# Patient Record
Sex: Female | Born: 1990 | Race: Black or African American | Hispanic: No | Marital: Married | Smoking: Never smoker
Health system: Southern US, Community
[De-identification: ages and names within clinical notes are randomized; demographics above are authoritative.]

## PROBLEM LIST (undated history)

## (undated) ENCOUNTER — Inpatient Hospital Stay (HOSPITAL_COMMUNITY): Payer: Self-pay

## (undated) ENCOUNTER — Inpatient Hospital Stay: Payer: Self-pay

## (undated) DIAGNOSIS — O139 Gestational [pregnancy-induced] hypertension without significant proteinuria, unspecified trimester: Secondary | ICD-10-CM

## (undated) DIAGNOSIS — Z789 Other specified health status: Secondary | ICD-10-CM

## (undated) DIAGNOSIS — J45909 Unspecified asthma, uncomplicated: Secondary | ICD-10-CM

## (undated) DIAGNOSIS — B009 Herpesviral infection, unspecified: Secondary | ICD-10-CM

## (undated) HISTORY — PX: CERVICAL CERCLAGE: SHX1329

---

## 2014-03-13 ENCOUNTER — Emergency Department: Payer: Self-pay | Admitting: Emergency Medicine

## 2014-05-02 ENCOUNTER — Emergency Department: Payer: Self-pay | Admitting: Emergency Medicine

## 2014-05-26 ENCOUNTER — Emergency Department: Payer: Self-pay | Admitting: Emergency Medicine

## 2014-06-05 ENCOUNTER — Emergency Department: Payer: Self-pay | Admitting: Emergency Medicine

## 2015-06-17 ENCOUNTER — Emergency Department
Admission: EM | Admit: 2015-06-17 | Discharge: 2015-06-17 | Disposition: A | Discharge status: Home / Self Care | Payer: Medicaid Other | Attending: Emergency Medicine | Admitting: Emergency Medicine

## 2015-06-17 DIAGNOSIS — O98519 Other viral diseases complicating pregnancy, unspecified trimester: Secondary | ICD-10-CM | POA: Insufficient documentation

## 2015-06-17 DIAGNOSIS — B349 Viral infection, unspecified: Secondary | ICD-10-CM | POA: Insufficient documentation

## 2015-06-17 DIAGNOSIS — Z3A00 Weeks of gestation of pregnancy not specified: Secondary | ICD-10-CM | POA: Diagnosis not present

## 2015-06-17 DIAGNOSIS — Z3490 Encounter for supervision of normal pregnancy, unspecified, unspecified trimester: Secondary | ICD-10-CM

## 2015-06-17 DIAGNOSIS — O210 Mild hyperemesis gravidarum: Secondary | ICD-10-CM | POA: Diagnosis present

## 2015-06-17 LAB — URINALYSIS COMPLETE WITH MICROSCOPIC (ARMC ONLY)
BILIRUBIN URINE: NEGATIVE
Bacteria, UA: NONE SEEN
GLUCOSE, UA: NEGATIVE mg/dL
HGB URINE DIPSTICK: NEGATIVE
Ketones, ur: NEGATIVE mg/dL
LEUKOCYTES UA: NEGATIVE
NITRITE: NEGATIVE
Protein, ur: NEGATIVE mg/dL
SPECIFIC GRAVITY, URINE: 1.029 (ref 1.005–1.030)
pH: 6 (ref 5.0–8.0)

## 2015-06-17 LAB — POCT PREGNANCY, URINE: PREG TEST UR: POSITIVE — AB

## 2015-06-17 LAB — CBC WITH DIFFERENTIAL/PLATELET
Basophils Absolute: 0 K/uL (ref 0–0.1)
Basophils Relative: 1 %
Eosinophils Absolute: 0.1 K/uL (ref 0–0.7)
Eosinophils Relative: 2 %
HCT: 40.6 % (ref 35.0–47.0)
Hemoglobin: 12.8 g/dL (ref 12.0–16.0)
Lymphocytes Relative: 22 %
Lymphs Abs: 2 K/uL (ref 1.0–3.6)
MCH: 25.8 pg — ABNORMAL LOW (ref 26.0–34.0)
MCHC: 31.4 g/dL — ABNORMAL LOW (ref 32.0–36.0)
MCV: 82.1 fL (ref 80.0–100.0)
Monocytes Absolute: 0.8 K/uL (ref 0.2–0.9)
Monocytes Relative: 9 %
Neutro Abs: 6.4 K/uL (ref 1.4–6.5)
Neutrophils Relative %: 68 %
Platelets: 207 K/uL (ref 150–440)
RBC: 4.95 MIL/uL (ref 3.80–5.20)
RDW: 18.2 % — ABNORMAL HIGH (ref 11.5–14.5)
WBC: 9.5 K/uL (ref 3.6–11.0)

## 2015-06-17 LAB — BASIC METABOLIC PANEL
ANION GAP: 7 (ref 5–15)
BUN: 10 mg/dL (ref 6–20)
CALCIUM: 9.5 mg/dL (ref 8.9–10.3)
CO2: 23 mmol/L (ref 22–32)
CREATININE: 0.48 mg/dL (ref 0.44–1.00)
Chloride: 106 mmol/L (ref 101–111)
GFR calc Af Amer: >60 mL/min (ref >60)
GLUCOSE: 86 mg/dL (ref 65–99)
Potassium: 3.8 mmol/L (ref 3.5–5.1)
Sodium: 136 mmol/L (ref 135–145)

## 2015-06-17 MED ORDER — ONDANSETRON 4 MG PO TBDP: 4.0000 mg | ORAL_TABLET | Freq: Three times a day (TID) | ORAL | Status: DC | PRN

## 2015-06-17 NOTE — ED Notes (Signed)
Patient comes in with generalized complaints of "not feeling well" for one month.  When patient questioned about symptoms patient states that she has fevers to come and go along with decreased appetite  and nausea for one month.

## 2015-06-17 NOTE — Discharge Instructions (Signed)
First Trimester of Pregnancy The first trimester of pregnancy is from week 1 until the end of week 12 (months 1 through 3). During this time, your baby will begin to develop inside you. At 6-8 weeks, the eyes and face are formed, and the heartbeat can be seen on ultrasound. At the end of 12 weeks, all the baby's organs are formed. Prenatal care is all the medical care you receive before the birth of your baby. Make sure you get good prenatal care and follow all of your doctor's instructions. HOME CARE  Medicines  Take medicine only as told by your doctor. Some medicines are safe and some are not during pregnancy.  Take your prenatal vitamins as told by your doctor.  Take medicine that helps you poop (stool softener) as needed if your doctor says it is okay. Diet  Eat regular, healthy meals.  Your doctor will tell you the amount of weight gain that is right for you.  Avoid raw meat and uncooked cheese.  If you feel sick to your stomach (nauseous) or throw up (vomit):  Eat 4 or 5 small meals a day instead of 3 large meals.  Try eating a few soda crackers.  Drink liquids between meals instead of during meals.  If you have a hard time pooping (constipation):  Eat high-fiber foods like fresh vegetables, fruit, and whole grains.  Drink enough fluids to keep your pee (urine) clear or pale yellow. Activity and Exercise  Exercise only as told by your doctor. Stop exercising if you have cramps or pain in your lower belly (abdomen) or low back.  Try to avoid standing for long periods of time. Move your legs often if you must stand in one place for a long time.  Avoid heavy lifting.  Wear low-heeled shoes. Sit and stand up straight.  You can have sex unless your doctor tells you not to. Relief of Pain or Discomfort  Wear a good support bra if your breasts are sore.  Take warm water baths (sitz baths) to soothe pain or discomfort caused by hemorrhoids. Use hemorrhoid cream if your  doctor says it is okay.  Rest with your legs raised if you have leg cramps or low back pain.  Wear support hose if you have puffy, bulging veins (varicose veins) in your legs. Raise (elevate) your feet for 15 minutes, 3-4 times a day. Limit salt in your diet. Prenatal Care  Schedule your prenatal visits by the twelfth week of pregnancy.  Write down your questions. Take them to your prenatal visits.  Keep all your prenatal visits as told by your doctor. Safety  Wear your seat belt at all times when driving.  Make a list of emergency phone numbers. The list should include numbers for family, friends, the hospital, and police and fire departments. General Tips  Ask your doctor for a referral to a local prenatal class. Begin classes no later than at the start of month 6 of your pregnancy.  Ask for help if you need counseling or help with nutrition. Your doctor can give you advice or tell you where to go for help.  Do not use hot tubs, steam rooms, or saunas.  Do not douche or use tampons or scented sanitary pads.  Do not cross your legs for long periods of time.  Avoid litter boxes and soil used by cats.  Avoid all smoking, herbs, and alcohol. Avoid drugs not approved by your doctor.  Do not use any tobacco products, including cigarettes,  chewing tobacco, and electronic cigarettes. If you need help quitting, ask your doctor. You may get counseling or other support to help you quit.  Visit your dentist. At home, brush your teeth with a soft toothbrush. Be gentle when you floss. GET HELP IF:  You are dizzy.  You have mild cramps or pressure in your lower belly.  You have a nagging pain in your belly area.  You continue to feel sick to your stomach, throw up, or have watery poop (diarrhea).  You have a bad smelling fluid coming from your vagina.  You have pain with peeing (urination).  You have increased puffiness (swelling) in your face, hands, legs, or ankles. GET HELP  RIGHT AWAY IF:   You have a fever.  You are leaking fluid from your vagina.  You have spotting or bleeding from your vagina.  You have very bad belly cramping or pain.  You gain or lose weight rapidly.  You throw up blood. It may look like coffee grounds.  You are around people who have [place] measles, fifth disease, or chickenpox.  You have a very bad headache.  You have shortness of breath.  You have any kind of trauma, such as from a fall or a car accident.   This information is not intended to replace advice given to you by your health care provider. Make sure you discuss any questions you have with your health care provider.   Document Released: 12/16/2007 Document Revised: 07/20/2014 Document Reviewed: 05/09/2013 Elsevier Interactive Patient Education 2016 [employer]   [facility]. Zofran as needed for nausea. Increase fluid intake. You may take Tylenol for muscle aches but do not take any anti-inflammatories.

## 2015-06-17 NOTE — ED Provider Notes (Signed)
[facility] Emergency Department Provider Note ____________________________________________  Time seen: Approximately 8:33 AM  I have reviewed the triage vital signs and the nursing notes.   HISTORY  Chief Complaint Nausea  HPI Charlene Key is a 24 y.o. female states that she has not been feeling well for one month. Patient states that she has multiple symptoms including congestion, nausea, decreased appetite for one month. She also states that she has had some diarrhea and reports that she is had diarrhea once today and vomited twice.She states she has had low-grade temp during this time. She has not taken any over-the-counter medication for this and has not seen any doctors in the month that she has been sick. She denies any urinary symptoms, she denies any ear pain, she denies any productive cough.   History reviewed. No pertinent past medical history.  There are no active problems to display for this patient.   History reviewed. No pertinent past surgical history.  Current Outpatient Rx  Name  Route  Sig  Dispense  Refill  . ondansetron (ZOFRAN ODT) 4 MG disintegrating tablet   Oral   Take 1 tablet (4 mg total) by mouth every 8 (eight) hours as needed for nausea or vomiting.   12 tablet   0     Allergies Review of patient's allergies indicates no known allergies.  History reviewed. No pertinent family history.  Social History Social History  Substance Use Topics  . Smoking status: Never Smoker   . Smokeless tobacco: None  . Alcohol Use: No    Review of Systems Constitutional: Positive low-grade fever/no chills Eyes: No visual changes. ENT: No sore throat. Cardiovascular: Denies chest pain. Respiratory: Denies shortness of breath. Gastrointestinal: No abdominal pain.  Positive nausea, positive vomiting.  Positive diarrhea.  No constipation. Genitourinary: Negative for dysuria. Musculoskeletal: Negative for back pain. Skin: Negative for  rash. Neurological: Negative for headaches, focal weakness or numbness.  10-point ROS otherwise negative.  ____________________________________________   PHYSICAL EXAM:  VITAL SIGNS: ED Triage Vitals  Enc Vitals Group     BP 06/17/15 0816 125/59 mmHg     Pulse Rate 06/17/15 0816 86     Resp 06/17/15 0816 18     Temp 06/17/15 0816 98.1 F (36.7 C)     Temp Source 06/17/15 0816 Oral     SpO2 06/17/15 0816 99 %     Weight 06/17/15 0816 200 lb (90.719 kg)     Height 06/17/15 0816  (1.6 m)     Head Cir --      Peak Flow --      Pain Score --      Pain Loc --      Pain Edu? --      Excl. in GC? --     Constitutional: Alert and oriented. Well appearing and in no acute distress. Eyes: Conjunctivae are normal. PERRL. EOMI. Head: Atraumatic. Nose: No congestion/rhinnorhea. Mouth/Throat: Mucous membranes are moist.  Oropharynx non-erythematous. Neck: No stridor.   Hematological/Lymphatic/Immunilogical: No cervical lymphadenopathy. Cardiovascular: Normal rate, regular rhythm. Grossly normal heart sounds.  Good peripheral circulation. Respiratory: Normal respiratory effort.  No retractions. Lungs CTAB. Gastrointestinal: Soft and nontender. No distention.  Musculoskeletal: No lower extremity tenderness nor edema.  No joint effusions. Neurologic:  Normal speech and language. No gross focal neurologic deficits are appreciated. No gait instability. Skin:  Skin is warm, dry and intact. No rash noted. Psychiatric: Mood and affect are normal. Speech and behavior are normal.  ____________________________________________  LABS (all labs ordered are listed, but only abnormal results are displayed)  Labs Reviewed  CBC WITH DIFFERENTIAL/PLATELET - Abnormal; Notable for the following:    MCH 25.8 (*)    MCHC 31.4 (*)    RDW 18.2 (*)    All other components within normal limits  URINALYSIS COMPLETEWITH MICROSCOPIC ([facility] ONLY) - Abnormal; Notable for the following:    Color, Urine  YELLOW (*)    APPearance CLEAR (*)    Squamous Epithelial / LPF 0-5 (*)    All other components within normal limits  POCT PREGNANCY, URINE - Abnormal; Notable for the following:    Preg Test, Ur POSITIVE (*)    All other components within normal limits  BASIC METABOLIC PANEL  POC URINE PREG, ED    PROCEDURES  Procedure(s) performed: None  Critical Care performed: No  ____________________________________________   INITIAL IMPRESSION / ASSESSMENT AND PLAN / ED COURSE  Pertinent labs & imaging results that were available during my care of the patient were reviewed by me and considered in my medical decision making (see chart for details).  Patient's pregnancy test was positive and patient was made aware. She is going to [facility]. She is given a prescription for Zofran as needed for nausea. She was also made aware that she may take Tylenol but to avoid anti-inflammatories. ____________________________________________   FINAL CLINICAL IMPRESSION(S) / ED DIAGNOSES  Final diagnoses:  Pregnancy  Viral illness      Tommi RumpsRhonda L Ashlen Kiger, PA-C 06/17/15 1054  Emily FilbertJonathan E Williams, MD 06/17/15 [id number]

## 2015-09-05 DIAGNOSIS — E669 Obesity, unspecified: Secondary | ICD-10-CM | POA: Insufficient documentation

## 2015-09-05 DIAGNOSIS — B009 Herpesviral infection, unspecified: Secondary | ICD-10-CM | POA: Insufficient documentation

## 2015-09-07 DIAGNOSIS — N883 Incompetence of cervix uteri: Secondary | ICD-10-CM | POA: Insufficient documentation

## 2015-09-27 DIAGNOSIS — Z8751 Personal history of pre-term labor: Secondary | ICD-10-CM | POA: Insufficient documentation

## 2016-07-13 HISTORY — PX: HYSTEROTOMY: SHX1776

## 2016-07-14 ENCOUNTER — Encounter (HOSPITAL_COMMUNITY): Payer: Self-pay | Admitting: Emergency Medicine

## 2016-07-14 ENCOUNTER — Emergency Department (HOSPITAL_COMMUNITY)
Admission: EM | Admit: 2016-07-14 | Discharge: 2016-07-15 | Disposition: A | Discharge status: Home / Self Care | Payer: Medicaid Other | Attending: Emergency Medicine | Admitting: Emergency Medicine

## 2016-07-14 DIAGNOSIS — R109 Unspecified abdominal pain: Secondary | ICD-10-CM | POA: Insufficient documentation

## 2016-07-14 DIAGNOSIS — O26892 Other specified pregnancy related conditions, second trimester: Secondary | ICD-10-CM | POA: Diagnosis present

## 2016-07-14 DIAGNOSIS — Z3A16 16 weeks gestation of pregnancy: Secondary | ICD-10-CM | POA: Diagnosis not present

## 2016-07-14 DIAGNOSIS — N898 Other specified noninflammatory disorders of vagina: Secondary | ICD-10-CM | POA: Diagnosis not present

## 2016-07-14 NOTE — ED Triage Notes (Signed)
Pt states she did not have dilation and curettage but a stitch placed in cervix.

## 2016-07-14 NOTE — ED Provider Notes (Signed)
[facility]-EMERGENCY DEPT Provider Note   CSN: [id number] Arrival date & time: 07/14/16  2058  By signing my name below, I, Bobbie Stackhristopher Reid, attest that this documentation has been prepared under the direction and in the presence of Zadie Rhineonald Mirely Pangle, MD. Electronically Signed: Bobbie Stackhristopher Reid, Scribe. 07/14/16. 11:41 PM. History   Chief Complaint Chief Complaint  Patient presents with  . Vaginal Discharge     The history is provided by the patient. No language interpreter was used.  Vaginal Discharge   This is a new problem. The current episode started 6 to 12 hours ago. The discharge occurs during urination. The discharge was bloody. She is pregnant. Associated symptoms include abdominal pain. Pertinent negatives include no fever, no vomiting and no dysuria. Her past medical history does not include STD.  Patient was at work earlier today and started to have some abdominal pain. She is 16 weeks into her current pregnancy; she had cerclage placed at 12 weeks.  She denies any fluid loss. She denies any recent falls or MVA. Patient has not been sexually active recently.  She is now feeling improved.   PMH - multiparity  OB History    Gravida Para Term Preterm AB Living   1             SAB TAB Ectopic Multiple Live Births                   Home Medications    Prior to Admission medications   Medication Sig Start Date End Date Taking? Authorizing Provider  ondansetron (ZOFRAN ODT) 4 MG disintegrating tablet Take 1 tablet (4 mg total) by mouth every 8 (eight) hours as needed for nausea or vomiting. 06/17/15   Tommi Rumpshonda L Summers, PA-C    Family History History reviewed. No pertinent family history.  Social History Social History  Substance Use Topics  . Smoking status: Never Smoker  . Smokeless tobacco: Never Used  . Alcohol use No     Allergies   Patient has no known allergies.   Review of Systems Review of Systems  Constitutional: Negative for fever.  Gastrointestinal:  Positive for abdominal pain. Negative for vomiting.  Genitourinary: Positive for vaginal discharge. Negative for dysuria.  All other systems reviewed and are negative.    Physical Exam Updated Vital Signs BP 125/70 (BP Location: Left Arm)   Pulse 86   Temp 98.2 F (36.8 C) (Oral)   Resp 18   Ht 5\' 3"  (1.6 m)   Wt 200 lb (90.7 kg)   SpO2 100%   BMI 35.43 kg/m   Physical Exam CONSTITUTIONAL: Well developed/well nourished HEAD: Normocephalic/atraumatic EYES: EOMI/PERRL ENMT: Mucous membranes moist NECK: supple no meningeal signs SPINE/BACK:entire spine nontender CV: S1/S2 noted, no murmurs/rubs/gallops noted LUNGS: Lungs are clear to auscultation bilaterally, no apparent distress ABDOMEN: soft, nontender, no rebound or guarding, bowel sounds noted throughout abdomen GU:no cva tenderness, no vaginal bleeding, small amount of brownish discharge.  No sutures noted.  No products of conception noted.  Nurse Darl PikesSusan chaperone present NEURO: Pt is awake/alert/appropriate, moves all extremitiesx4.  No facial droop.   EXTREMITIES: pulses normal/equal, full ROM SKIN: warm, color normal PSYCH: no abnormalities of mood noted, alert and oriented to situation   ED Treatments / Results  DIAGNOSTIC STUDIES: Oxygen Saturation is 100% on RA, normal by my interpretation.    COORDINATION OF CARE: 11:41 PM Discussed treatment plan with pt at bedside and pt agreed to plan.  Labs (all labs ordered are listed, but only  abnormal results are displayed) Labs Reviewed  WET PREP, GENITAL - Abnormal; Notable for the following:       Result Value   WBC, Wet Prep HPF POC MANY (*)    All other components within normal limits  URINALYSIS, ROUTINE W REFLEX MICROSCOPIC - Abnormal; Notable for the following:    APPearance HAZY (*)    Specific Gravity, Urine 1.038 (*)    Bilirubin Urine SMALL (*)    Ketones, ur 5 (*)    Protein, ur 30 (*)    Leukocytes, UA TRACE (*)    Bacteria, UA RARE (*)    All  other components within normal limits  URINE CULTURE  GC/CHLAMYDIA PROBE AMP ([place]) NOT AT [facility]    EKG  EKG Interpretation None       Radiology No results found.  Procedures Procedures (including critical care time)  Medications Ordered in ED Medications - No data to display   Initial Impression / Assessment and Plan / ED Course  I have reviewed the triage vital signs and the nursing notes.  Pertinent labs  results that were available during my care of the patient were reviewed by me and considered in my medical decision making (see chart for details).  Clinical Course     Pt stable Abdominal exam - nontender No vag bleeding noted on exam I feel she is appropriate for d/c home She is instructed to call her OB later today for close f/u to have re-exam to ensure no new issues with cervical cerclage Otherwise no signs of acute abdominal/OB/GYN emergency   Final Clinical Impressions(s) / ED Diagnoses   Final diagnoses:  Vaginal discharge    New Prescriptions New Prescriptions   No medications on file   I personally performed the services described in this documentation, which was scribed in my presence. The recorded information has been reviewed and is accurate.       Zadie Rhine, MD 07/15/16 [phone]

## 2016-07-14 NOTE — ED Triage Notes (Signed)
Pt states she had dilation and curettage last month and today developed abd pain with brown discharge.

## 2016-07-15 ENCOUNTER — Encounter (HOSPITAL_COMMUNITY): Payer: Self-pay

## 2016-07-15 LAB — WET PREP, GENITAL
CLUE CELLS WET PREP: NONE SEEN
SPERM: NONE SEEN
Trich, Wet Prep: NONE SEEN
YEAST WET PREP: NONE SEEN

## 2016-07-15 LAB — URINALYSIS, ROUTINE W REFLEX MICROSCOPIC
GLUCOSE, UA: NEGATIVE mg/dL
HGB URINE DIPSTICK: NEGATIVE
Ketones, ur: 5 mg/dL — AB
NITRITE: NEGATIVE
Protein, ur: 30 mg/dL — AB
SPECIFIC GRAVITY, URINE: 1.038 — AB (ref 1.005–1.030)
WBC, UA: NONE SEEN WBC/hpf (ref 0–5)
pH: 5 (ref 5.0–8.0)

## 2016-07-15 NOTE — Discharge Instructions (Signed)
PLEASE CALL YOUR OB DOCTOR LATER TODAY FOR CLOSE FOLLOWUP PLEASE RETURN FOR ANY WORSENED ABDOMINAL PAIN OR VAGINAL BLEEDING OVER NEXT 24-48 HOURS

## 2016-07-16 LAB — URINE CULTURE

## 2016-07-16 LAB — GC/CHLAMYDIA PROBE AMP (~~LOC~~) NOT AT ARMC
Chlamydia: NEGATIVE
Neisseria Gonorrhea: NEGATIVE

## 2016-09-16 ENCOUNTER — Inpatient Hospital Stay (HOSPITAL_COMMUNITY)
Admission: AD | Admit: 2016-09-16 | Discharge: 2016-09-17 | Disposition: A | Discharge status: Home / Self Care | Payer: Medicaid Other | Attending: Obstetrics & Gynecology | Admitting: Obstetrics & Gynecology

## 2016-09-16 ENCOUNTER — Encounter (HOSPITAL_COMMUNITY): Payer: Self-pay | Admitting: *Deleted

## 2016-09-16 DIAGNOSIS — O26892 Other specified pregnancy related conditions, second trimester: Secondary | ICD-10-CM | POA: Diagnosis not present

## 2016-09-16 DIAGNOSIS — Z3A25 25 weeks gestation of pregnancy: Secondary | ICD-10-CM | POA: Diagnosis present

## 2016-09-16 DIAGNOSIS — R1030 Lower abdominal pain, unspecified: Secondary | ICD-10-CM | POA: Insufficient documentation

## 2016-09-16 HISTORY — DX: Other specified health status: Z78.9

## 2016-09-16 NOTE — ED Notes (Signed)
[facility]

## 2016-09-16 NOTE — ED Provider Notes (Signed)
[facility] Provider Note   CSN: [id number] Arrival date & time: 09/16/16  2004  By signing my name below, I, Alyssa Grove, attest that this documentation has been prepared under the direction and in the presence of Loren Racer, MD. Electronically Signed: Alyssa Grove, ED Scribe. 09/16/16. 9:43 PM.   History   Chief Complaint Chief Complaint  Patient presents with  . Abdominal Pain   The history is provided by the patient. No language interpreter was used.   HPI Comments: Charlene Key is a 26 y.o. female who presents to the Emergency Department complaining of gradual onset, intermittent, moderate suprapubic abdominal pain for 1 day. She is currently [redacted] weeks pregnant (via ultrasound). This is the patients 7th pregnancy. No problems with this pregnancy. Pain feels similar to contractions and notes pain does not come every few minutes but is "spaced out". Pt states she is feeling the movements of the baby. Pt denies nausea, vomiting, diarrhea, vaginal discharge, vaginal bleeding, fever, chills, or any other complaints at this time.  History reviewed. No pertinent past medical history.  There are no active problems to display for this patient.   History reviewed. No pertinent surgical history.  OB History    Gravida Para Term Preterm AB Living   7 6 5 1   6    SAB TAB Ectopic Multiple Live Births         1 7       Home Medications    Prior to Admission medications   Medication Sig Start Date End Date Taking? Authorizing Provider  Prenatal Vit-Fe Fumarate-FA (PRENATAL MULTIVITAMIN) TABS tablet Take 1 tablet by mouth daily.   Yes Historical Provider, MD    Family History History reviewed. No pertinent family history.  Social History Social History  Substance Use Topics  . Smoking status: Never Smoker  . Smokeless tobacco: Never Used  . Alcohol use No     Allergies   Patient has no known allergies.  Review of Systems Review of Systems  Constitutional:  Negative for chills and fever.  Gastrointestinal: Positive for abdominal pain. Negative for constipation, diarrhea, nausea and vomiting.  Genitourinary: Negative for dysuria, flank pain, pelvic pain, vaginal bleeding and vaginal discharge.  Musculoskeletal: Negative for back pain, myalgias, neck pain and neck stiffness.  Neurological: Negative for dizziness, weakness, light-headedness, numbness and headaches.  All other systems reviewed and are negative.   Physical Exam Updated Vital Signs BP 123/77   Pulse 96   Temp 98.8 F (37.1 C) (Oral)   Resp 18   Ht 5\' 3"  (1.6 m)   Wt 200 lb (90.7 kg)   SpO2 100%   BMI 35.43 kg/m   Physical Exam  Constitutional: She is oriented to person, place, and time. She appears well-developed and well-nourished.  HENT:  Head: Normocephalic and atraumatic.  Mouth/Throat: Oropharynx is clear and moist.  Eyes: EOM are normal. Pupils are equal, round, and reactive to light.  Neck: Normal range of motion. Neck supple.  Cardiovascular: Normal rate and regular rhythm.   Pulmonary/Chest: Effort normal and breath sounds normal.  Abdominal: Soft. Bowel sounds are normal. There is no tenderness. There is no rebound and no guarding.  Gravid abdomen with uterine fundus above the umbilicus. No tenderness to palpation.   Musculoskeletal: Normal range of motion. She exhibits no edema or tenderness.  Neurological: She is alert and oriented to person, place, and time.  Skin: Skin is warm and dry. No rash noted. No erythema.  Psychiatric: She has a  normal mood and affect. Her behavior is normal.  Nursing note and vitals reviewed.    ED Treatments / Results  DIAGNOSTIC STUDIES: Oxygen Saturation is 100% on RA, normal by my interpretation.    COORDINATION OF CARE: 9:37 PM Discussed treatment plan with pt at bedside which includes transfer for overnight monitoring and pt agreed to plan.  Labs (all labs ordered are listed, but only abnormal results are  displayed) Labs Reviewed - No data to display  EKG  EKG Interpretation None       Radiology No results found.  Procedures Procedures (including critical care time)  Medications Ordered in ED Medications - No data to display   Initial Impression / Assessment and Plan / ED Course  I have reviewed the triage vital signs and the nursing notes.  Pertinent labs & imaging results that were available during my care of the patient were reviewed by me and considered in my medical decision making (see chart for details).   She is very well-appearing. She has no abdominal pain at this point. Normal fetal heart tones. No evidence of current contractions. She has history of cerclage placement. Discussed with OB on call Dr. Katrinka BlazingSmith. Advises transfer to the MAU for obstetric evaluation.  I personally performed the services described in this documentation, which was scribed in my presence. The recorded information has been reviewed and is accurate.     Final Clinical Impressions(s) / ED Diagnoses   Final diagnoses:  Lower abdominal pain  [redacted] weeks gestation of pregnancy    New Prescriptions New Prescriptions   No medications on file     Loren Raceravid Ayodeji Keimig, MD 09/16/16 2233

## 2016-09-16 NOTE — Progress Notes (Signed)
[facility] called about patient who presents to [facility] ED with complaints of abdominal pain and contractions x1 day; patient is a G7P5 at 25 and 4/[redacted] weeks along in her pregnancy at this time; patient stated her previous delivery was at 24 weeks; she received a cerclage at 12 weeks for this pregnancy; [facility] [place]; [facility] and assessing at this time; Dr Erin FullingHarraway-Smith notified of patient's arrival, complaints, and previous history; orders given to continue EFM and when patient is cleared medically for abdominal pain she can be transferred to MAU for further assessment; Casimiro NeedleMichael, ED RN made aware of orders at this time

## 2016-09-16 NOTE — ED Triage Notes (Signed)
Pt c/o suprapubic abdominal pain that started yesterday; pt denies any n/v/d; pt denies any vaginal discharge but states the pain feels like contractions, pt is [redacted] weeks pregnant; pt states the pain is intermittent

## 2016-09-17 ENCOUNTER — Encounter (HOSPITAL_COMMUNITY): Payer: Self-pay

## 2016-09-17 DIAGNOSIS — Z3A25 25 weeks gestation of pregnancy: Secondary | ICD-10-CM | POA: Diagnosis present

## 2016-09-17 DIAGNOSIS — R1030 Lower abdominal pain, unspecified: Secondary | ICD-10-CM | POA: Diagnosis present

## 2016-09-17 DIAGNOSIS — O26892 Other specified pregnancy related conditions, second trimester: Secondary | ICD-10-CM | POA: Diagnosis not present

## 2016-09-17 NOTE — Discharge Instructions (Signed)

## 2016-09-17 NOTE — MAU Provider Note (Signed)
Chief Complaint: Abdominal Pain   First Provider Initiated Contact with Patient 09/17/16 [phone]     SUBJECTIVE HPI: Charlene Key is a 26 y.o. [id number] at [redacted]w[redacted]d who presents to Maternity Admissions from [facility] ED for further eval of 1 day hx of contractions. Patient has cerclage in place since 12 weeks due to h/o 24 week delivery.  Denies leakage of fluid or vaginal bleeding. Good fetal movement.   Location: lower abdomen Quality: sharp Severity: 3/10 in pain scale Duration: intermitent Context: pregnant Timing: yesterday Modifying factors: none Associated signs and symptoms: none  Pregnancy Course:   Past Medical History:  Diagnosis Date  . Medical history non-contributory    OB History  Gravida Para Term Preterm AB Living  7 6 5 1   6   SAB TAB Ectopic Multiple Live Births        1 7    # Outcome Date GA Lbr Len/2nd Weight Sex Delivery Anes PTL Lv  7 Current           6A Preterm 09/27/15    F CS-Classical None  LIV  6B Preterm 09/27/15    F CS-Classical  Y DEC  5 Term 10/17/14    M Vag-Spont   LIV  4 Term 07/11/12    M Vag-Spont None  LIV  3 Term 11/10/10    M Vag-Spont  N LIV  2 Term 09/22/09    M Vag-Spont  N LIV  1 Term 02/15/07    Kateri Plummer LIV     Past Surgical History:  Procedure Laterality Date  . CESAREAN SECTION     History reviewed. No pertinent family history. Social History  Substance Use Topics  . Smoking status: Never Smoker  . Smokeless tobacco: Never Used  . Alcohol use No   No Known Allergies Prescriptions Prior to Admission  Medication Sig Dispense Refill Last Dose  . Prenatal Vit-Fe Fumarate-FA (PRENATAL MULTIVITAMIN) TABS tablet Take 1 tablet by mouth daily.   09/16/2016 at Unknown time    I have reviewed patient's Past Medical Hx, Surgical Hx, Family Hx, Social Hx, medications and allergies.   ROS:  Review of Systems  Constitutional: Negative for chills and fever.  HENT: Negative for congestion and sore throat.   Eyes: Negative for  photophobia and visual disturbance.  Respiratory: Negative for shortness of breath and wheezing.   Cardiovascular: Negative for chest pain, palpitations and leg swelling.  Gastrointestinal: Positive for abdominal pain. Negative for nausea and vomiting.  Genitourinary: Negative for dysuria, frequency, urgency, vaginal bleeding, vaginal discharge and vaginal pain.  Musculoskeletal: Negative for back pain and neck pain.  Neurological: Negative for light-headedness and headaches.    Physical Exam   Patient Vitals for the past 24 hrs:  BP Temp Temp src Pulse Resp SpO2 Height Weight  09/17/16 0135 132/65 98.1 F (36.7 C) Oral 83 18 - - -  09/17/16 0000 (!) 109/54 - - 81 18 100 % - -  09/16/16 2330 115/67 - - 84 - 100 % - -  09/16/16 2300 112/75 - - 85 - 100 % - -  09/16/16 2230 119/74 - - 79 - 100 % - -  09/16/16 2200 123/77 - - 96 18 100 % - -  09/16/16 2134 114/65 - - 89 20 99 % - -  09/16/16 2030 124/67 98.8 F (37.1 C) Oral 94 18 100 % 5\' 3"  (1.6 m) 200 lb (90.7 kg)   Constitutional: Well-developed, well-nourished female in no acute distress.  Cardiovascular: normal rate Respiratory: normal effort GI: Abd soft, non-tender, gravid appropriate for gestational age. Pos BS x 4 MS: Extremities nontender, no edema, normal ROM Neurologic: Alert and oriented x 4.  GU: Neg CVAT.  Pelvic: NEFG, physiologic discharge, no blood, cervix clean. No CMT  Dilation: Closed Exam by:: Dr. Abelardo DieselMcmullen  FHT:  Baseline 145 bpm, moderate variability, accelerations present, no decelerations Contractions: none   Labs: No results found for this or any previous visit (from the past 24 hour(s)).  Imaging:  No results found.  MAU Course: Orders Placed This Encounter  Procedures  . Diet - low sodium heart healthy  . Increase activity slowly  . Discharge patient Discharge disposition: 01-Home or Self Care; Discharge patient date: 09/17/2016   Meds ordered this encounter  Medications  . Prenatal Vit-Fe  Fumarate-FA (PRENATAL MULTIVITAMIN) TABS tablet    Sig: Take 1 tablet by mouth daily.    Assessment & Plan: 1. Lower abdominal pain   2. [redacted] weeks gestation of pregnancy   Cerclage intact with closed cervical os. No signs of labor, leakage or bleeding. --Red flags reviewed for return for MAU --Labor precautions and fetal kick counts reviewed --Discharge home in stable condition    Allergies as of 09/17/2016   No Known Allergies     Medication List    TAKE these medications   prenatal multivitamin Tabs tablet Take 1 tablet by mouth daily.       Wendee Beaversavid J McMullen, DO, PGY-1 09/17/2016, 3:13 AM   I have participated in the care of this patient and I agree with the above. FHR 140s, +15x15 accels, no decels, appropriate for GA, no ctx per toco, cx C/L. Cam HaiSHAW, KIMBERLY CNM 3:31 AM 09/17/2016

## 2016-10-16 ENCOUNTER — Observation Stay
Admission: EM | Admit: 2016-10-16 | Discharge: 2016-10-16 | Discharge status: Against Medical Advice | Payer: Medicaid Other | Attending: Obstetrics & Gynecology | Admitting: Obstetrics & Gynecology

## 2016-10-16 DIAGNOSIS — O26873 Cervical shortening, third trimester: Secondary | ICD-10-CM | POA: Diagnosis not present

## 2016-10-16 DIAGNOSIS — O0943 Supervision of pregnancy with grand multiparity, third trimester: Principal | ICD-10-CM | POA: Insufficient documentation

## 2016-10-16 DIAGNOSIS — Z3A29 29 weeks gestation of pregnancy: Secondary | ICD-10-CM | POA: Diagnosis not present

## 2016-10-16 DIAGNOSIS — Z3490 Encounter for supervision of normal pregnancy, unspecified, unspecified trimester: Secondary | ICD-10-CM

## 2016-10-16 DIAGNOSIS — Z8619 Personal history of other infectious and parasitic diseases: Secondary | ICD-10-CM | POA: Diagnosis not present

## 2016-10-16 DIAGNOSIS — O09293 Supervision of pregnancy with other poor reproductive or obstetric history, third trimester: Secondary | ICD-10-CM | POA: Insufficient documentation

## 2016-10-16 MED ORDER — LACTATED RINGERS IV SOLN
INTRAVENOUS | Status: DC
  Administered 2016-10-16 (×2): via INTRAVENOUS

## 2016-10-16 MED ORDER — BETAMETHASONE SOD PHOS & ACET 6 (3-3) MG/ML IJ SUSP
12.0000 mg | INTRAMUSCULAR | Status: DC
  Administered 2016-10-16: 12 mg via INTRAMUSCULAR
  Filled 2016-10-16: qty 1

## 2016-10-16 NOTE — OB Triage Note (Signed)
Patient came in c/o contractions that began around 0500. Patient noticed contractions shortly after patient went up to bathroom. Patient feels contraction in her lower abdomen to lower back. Rating pain 4 out of 10. Denies vaginal bleeding and discharge. Reports positive fetal movement.

## 2016-10-16 NOTE — Progress Notes (Signed)
Pt came in c/o contractions. IV was started and contractions went away. Pt's baby had a 5 minute deceleration around 10:05 but then recovered and returned back to baseline. MD made aware and suggested keeping patient until 16:00 this afternoon for monitoring to be safe. Pt was very adamant about leaving any ways. Pt stated at 1300 she was "ready to go". I educated patient on the risks of leaving and let her know she would be leaving against medical advice. Pt voiced understanding and insisted on still leaving. Pt signed AMA form at 1318 and left with her mother.

## 2016-10-17 LAB — URINE CULTURE: Culture: <10000 — AB

## 2016-10-27 DIAGNOSIS — Z8619 Personal history of other infectious and parasitic diseases: Secondary | ICD-10-CM | POA: Diagnosis not present

## 2016-10-27 NOTE — Discharge Summary (Signed)
[name] is a 26 y.o. female. She is at [redacted]w[redacted]d gestation. Estimated Date of Delivery: 12/26/16  Prenatal care site: Health department - records unavailable at time of visit  Chief complaint: contractions  Location: gravid uterus Onset/timing: this morning Duration: several hours Quality:  cramping Severity: moderate Aggravating or alleviating conditions: none Associated signs/symptoms: no VB, LOF, +FM Context: Miss Crisoforo Oxford is a [id number] @ 29+[redacted] weeks gestation who has not received care here and has a cerclage placed due to history of rescue cerclage for last pregnancy with di-di twins and short cervix with dilation. That pregnancy ended with a 24week classical cesarean and death of one twin after birth due to NEC.   She has been having contractions since this morning.    Maternal Medical History:   Past Medical History:  Diagnosis Date  . 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 CESAREAN SECTION      No Known Allergies  Prior to Admission medications   Medication Sig Start Date End Date Taking? Authorizing Provider  Prenatal Vit-Fe Fumarate-FA (PRENATAL MULTIVITAMIN) TABS tablet Take 1 tablet by mouth daily.   Yes Historical Provider, MD     Social History: She  reports that she has never smoked. She has never used smokeless tobacco. She reports that she does not drink alcohol or use drugs.  Family History: no history of gyn cancers  Review of Systems: A full review of systems was performed and negative except as noted in the HPI.     O:  BP 121/72 (BP Location: Right Arm)   Pulse 96   Temp 98.1 F (36.7 C) (Oral)   Resp 18   Ht  (1.6 m)   Wt 89.8 kg (198 lb)   SpO2 100%   BMI 35.07 kg/m  No results found for this or any previous visit (from the past 48 hour(s)).   Constitutional: NAD, AAOx3  HE/ENT: extraocular movements grossly intact, moist mucous membranes CV: RRR PULM: nl respiratory effort, CTABL     Abd:  gravid, non-tender, non-distended, soft      Ext: Non-tender, Nonedmeatous   Psych: mood appropriate, speech normal Pelvic: 1/long/high/posterior/firm  NST: 135 Baseline:  Variability: moderate Accelerations present x >2 Decelerations present - 5 minute deceleration with recovery Time >47mins TOCO: 3-7 min prior to fluid bolus   A/P: 26 y.o. [redacted]w[redacted]d here for antenatal surveillance for high risk pregnancy, rule out labor  Labor: contractions resolved after IV fluid bolus   Fetal Wellbeing: Category 2 tracing.  Reactive NST   Recommended minimum of 6-hour surveillance after deceleration to confirmfetal well-being, including ultrasound  Patient declined and left AMA. Papers signed.  ----- Ranae Plumber, MD Attending Obstetrician and Gynecologist [facility], Department of OB/GYN [facility]

## 2017-01-11 ENCOUNTER — Emergency Department: Payer: Medicaid Other

## 2017-01-11 ENCOUNTER — Inpatient Hospital Stay
Admission: EM | Admit: 2017-01-11 | Discharge: 2017-01-13 | DRG: 769 | Disposition: A | Discharge status: Home / Self Care | Payer: Medicaid Other | Attending: Obstetrics and Gynecology | Admitting: Obstetrics and Gynecology

## 2017-01-11 DIAGNOSIS — D649 Anemia, unspecified: Secondary | ICD-10-CM | POA: Diagnosis present

## 2017-01-11 DIAGNOSIS — Z9889 Other specified postprocedural states: Secondary | ICD-10-CM

## 2017-01-11 DIAGNOSIS — O9081 Anemia of the puerperium: Secondary | ICD-10-CM | POA: Diagnosis present

## 2017-01-11 DIAGNOSIS — N939 Abnormal uterine and vaginal bleeding, unspecified: Secondary | ICD-10-CM

## 2017-01-11 LAB — HEPATIC FUNCTION PANEL
ALBUMIN: 3.3 g/dL — AB (ref 3.5–5.0)
ALK PHOS: 66 U/L (ref 38–126)
ALT: 14 U/L (ref 14–54)
AST: 20 U/L (ref 15–41)
BILIRUBIN TOTAL: 1.4 mg/dL — AB (ref 0.3–1.2)
Bilirubin, Direct: 0.1 mg/dL (ref 0.1–0.5)
Indirect Bilirubin: 1.3 mg/dL — ABNORMAL HIGH (ref 0.3–0.9)
TOTAL PROTEIN: 6.4 g/dL — AB (ref 6.5–8.1)

## 2017-01-11 LAB — LIPASE, BLOOD: LIPASE: 26 U/L (ref 11–51)

## 2017-01-11 LAB — PROTIME-INR
INR: 1.04
Prothrombin Time: 13.6 seconds (ref 11.4–15.2)

## 2017-01-11 LAB — DIFFERENTIAL
BASOS ABS: 0.1 10*3/uL (ref 0–0.1)
Basophils Relative: 1 %
EOS ABS: 0.2 10*3/uL (ref 0–0.7)
Eosinophils Relative: 2 %
LYMPHS ABS: 1.9 10*3/uL (ref 1.0–3.6)
LYMPHS PCT: 17 %
MONOS PCT: 8 %
Monocytes Absolute: 0.8 10*3/uL (ref 0.2–0.9)
NEUTROS PCT: 72 %
Neutro Abs: 7.8 10*3/uL — ABNORMAL HIGH (ref 1.4–6.5)

## 2017-01-11 LAB — CBC
HEMATOCRIT: 31.9 % — AB (ref 35.0–47.0)
HEMOGLOBIN: 10.3 g/dL — AB (ref 12.0–16.0)
MCH: 25.3 pg — ABNORMAL LOW (ref 26.0–34.0)
MCHC: 32.4 g/dL (ref 32.0–36.0)
MCV: 78.1 fL — ABNORMAL LOW (ref 80.0–100.0)
Platelets: 280 10*3/uL (ref 150–440)
RBC: 4.08 MIL/uL (ref 3.80–5.20)
RDW: 18.7 % — ABNORMAL HIGH (ref 11.5–14.5)
WBC: 10.8 10*3/uL (ref 3.6–11.0)

## 2017-01-11 LAB — BASIC METABOLIC PANEL
ANION GAP: 6 (ref 5–15)
BUN: 9 mg/dL (ref 6–20)
CHLORIDE: 110 mmol/L (ref 101–111)
CO2: 24 mmol/L (ref 22–32)
Calcium: 8.1 mg/dL — ABNORMAL LOW (ref 8.9–10.3)
Creatinine, Ser: 0.71 mg/dL (ref 0.44–1.00)
GFR calc non Af Amer: >60 mL/min (ref >60)
GLUCOSE: 89 mg/dL (ref 65–99)
POTASSIUM: 3.8 mmol/L (ref 3.5–5.1)
Sodium: 140 mmol/L (ref 135–145)

## 2017-01-11 LAB — LACTIC ACID, PLASMA
LACTIC ACID, VENOUS: 3.8 mmol/L — AB (ref 0.5–1.9)
LACTIC ACID, VENOUS: 1.8 mmol/L (ref 0.5–1.9)

## 2017-01-11 LAB — APTT: APTT: 26 s (ref 24–36)

## 2017-01-11 LAB — TROPONIN I

## 2017-01-11 LAB — ABO/RH: ABO/RH(D): O NEG

## 2017-01-11 LAB — PREPARE RBC (CROSSMATCH)

## 2017-01-11 LAB — HCG, QUANTITATIVE, PREGNANCY: hCG, Beta Chain, Quant, S: 3 m[IU]/mL (ref <5)

## 2017-01-11 LAB — ETHANOL: Alcohol, Ethyl (B): <5 mg/dL (ref <5)

## 2017-01-11 MED ORDER — FLEET ENEMA 7-19 GM/118ML RE ENEM: 1.0000 | ENEMA | Freq: Once | RECTAL | Status: DC

## 2017-01-11 MED ORDER — PRENATAL MULTIVITAMIN CH
1.0000 | ORAL_TABLET | Freq: Every day | ORAL | Status: DC
  Administered 2017-01-13: 1 via ORAL
  Filled 2017-01-11 (×3): qty 1

## 2017-01-11 MED ORDER — LACTATED RINGERS IV SOLN
INTRAVENOUS | Status: DC
  Administered 2017-01-11: 22:00:00 via INTRAVENOUS

## 2017-01-11 MED ORDER — SODIUM CHLORIDE 0.9 % IV SOLN: 10.0000 mL/h | Freq: Once | INTRAVENOUS | Status: DC

## 2017-01-11 MED ORDER — LORAZEPAM 2 MG/ML IJ SOLN
INTRAMUSCULAR | Status: AC
  Filled 2017-01-11: qty 1

## 2017-01-11 MED ORDER — LACTATED RINGERS IV SOLN
INTRAVENOUS | Status: DC
  Administered 2017-01-11 – 2017-01-12 (×3): via INTRAVENOUS

## 2017-01-11 MED ORDER — CEFOXITIN SODIUM 2 G IV SOLR
2.0000 g | Freq: Once | INTRAVENOUS | Status: DC
  Filled 2017-01-11: qty 2

## 2017-01-11 MED ORDER — ONDANSETRON HCL 4 MG/2ML IJ SOLN
4.0000 mg | Freq: Once | INTRAMUSCULAR | Status: AC
  Administered 2017-01-12: 4 mg via INTRAVENOUS
  Filled 2017-01-11: qty 2

## 2017-01-11 MED ORDER — DEXTROSE 5 % IV BOLUS
2.0000 g | INTRAVENOUS | Status: AC
  Administered 2017-01-12: 50 mL via INTRAVENOUS
  Filled 2017-01-11: qty 100

## 2017-01-11 MED ORDER — DEXTROSE 5 % IV SOLN
2.0000 g | INTRAVENOUS | Status: DC
  Filled 2017-01-11: qty 2

## 2017-01-11 MED ORDER — SODIUM CHLORIDE 0.9 % IV SOLN
Freq: Once | INTRAVENOUS | Status: AC
  Administered 2017-01-11: 16:00:00 via INTRAVENOUS

## 2017-01-11 MED ORDER — LORAZEPAM 2 MG/ML IJ SOLN: 1.0000 mg | Freq: Once | INTRAMUSCULAR | Status: DC

## 2017-01-11 MED ORDER — ONDANSETRON HCL 4 MG/2ML IJ SOLN: 4.0000 mg | Freq: Four times a day (QID) | INTRAMUSCULAR | Status: DC | PRN

## 2017-01-11 MED ORDER — ONDANSETRON HCL 4 MG PO TABS: 4.0000 mg | ORAL_TABLET | Freq: Four times a day (QID) | ORAL | Status: DC | PRN
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
## 2017-01-11 NOTE — ED Provider Notes (Addendum)
[facility] Emergency Department Provider Note   ____________________________________________   First MD Initiated Contact with Patient 01/11/17 1451     (approximate)  I have reviewed the triage vital signs and the nursing notes.   HISTORY  Chief Complaint Vaginal Bleeding  Chief complaint is severe vaginal bleeding  HPI Charlene Key is a 26 y.o. female patient reportedly [facility] this [facility] of last month and [facility] she's had several episodes of very heavy vaginal bleeding requiring blood transfusion. Today she began having some bleeding again and then drove herself to the hospital in the emergency room she passed out on the stretcher and began seizing. Her pants were saturated with blood. And at least 2 units of blood clot were appreciated in her underwear   Past Medical History:  Diagnosis Date  . Medical history non-contributory     Patient Active Problem List   Diagnosis Date Noted  . History of herpes genitalis 10/27/2016  . Pregnancy 10/16/2016    Past Surgical History:  Procedure Laterality Date  . CESAREAN SECTION      Prior to Admission medications   Medication Sig Start Date End Date Taking? Authorizing Provider  acetaminophen (TYLENOL) 325 MG tablet Take 650 mg by mouth every 6 (six) hours as needed. 12/24/16  Yes [provider]  docusate sodium (COLACE) 100 MG capsule Take 100 mg by mouth 2 (two) times daily as needed. 12/24/16 01/23/17 Yes [provider]  ibuprofen (ADVIL,MOTRIN) 600 MG tablet Take 600 mg by mouth every 6 (six) hours. 12/24/16  Yes [provider]  Prenatal Vit-Fe Fumarate-FA (PRENATAL MULTIVITAMIN) TABS tablet Take 1 tablet by mouth daily.    [provider]    Allergies Patient has no known allergies.  History reviewed. No pertinent family history.  Social History Social History  Substance Use Topics  . Smoking status:  Never Smoker  . Smokeless tobacco: Never Used  . Alcohol use No    Review of Systems  Constitutional: No fever/chills Eyes: No visual changes. ENT: No sore throat. Cardiovascular: Denies chest pain. Respiratory: Denies shortness of breath. Gastrointestinal: No abdominal pain.  No nausea, no vomiting.  No diarrhea.  No constipation. Genitourinary: Negative for dysuria. Musculoskeletal: Negative for back pain. Skin: Negative for rash. Neurological: Negative for headaches, focal weakness or numbness.  ____________________________________________   PHYSICAL EXAM:  VITAL SIGNS: ED Triage Vitals  Enc Vitals Group     BP      Pulse      Resp      Temp      Temp src      SpO2      Weight      Height      Head Circumference      Peak Flow      Pain Score      Pain Loc      Pain Edu?      Excl. in GC?     Constitutional: Alert and oriented. Well appearing and in no acute distress.At present. Initially patient was unconscious lying on stretcher then she began seizing seizing tonic-clonic seizure she was rolled to her side so she wouldn't aspirate with some difficulty IV was obtained patient seizure stopped before Ativan could be given. Patient seemed to wake up rapidly am unsure if this was a seizure-like episode resulting from her hypotension and lack of blood flow to the brain or actually a true  seizure. She has no history of previous seizures. Eyes: Conjunctivae are normal.  Head: Atraumatic. Nose: No congestion/rhinnorhea. Mouth/Throat: Mucous membranes are moist.  Oropharynx non-erythematous. Neck: No stridor. Cardiovascular: Normal rate, regular rhythm. Grossly normal heart sounds.  Good peripheral circulation. Respiratory: Normal respiratory effort.  No retractions. Lungs CTAB. Gastrointestinal: Soft and nontender. No distention. No abdominal bruits. No CVA tenderness. Genitourinary: Episode of massive vaginal bleeding now stopped Musculoskeletal: No lower extremity  tenderness nor edema.  No joint effusions. Neurologic:  Normal speech and language. No gross focal neurologic deficits are appreciated. Skin:  Skin is warm, dry and intact. No rash noted. Psychiatric: Mood and affect are normal. Speech and behavior are normal.  ____________________________________________   LABS (all labs ordered are listed, but only abnormal results are displayed)  Labs Reviewed  LACTIC ACID, PLASMA - Abnormal; Notable for the following:       Result Value   Lactic Acid, Venous 3.8 (*)    All other components within normal limits  CBC - Abnormal; Notable for the following:    Hemoglobin 10.3 (*)    HCT 31.9 (*)    MCV 78.1 (*)    MCH 25.3 (*)    RDW 18.7 (*)    All other components within normal limits  DIFFERENTIAL - Abnormal; Notable for the following:    Neutro Abs 7.8 (*)    All other components within normal limits  BASIC METABOLIC PANEL - Abnormal; Notable for the following:    Calcium 8.1 (*)    All other components within normal limits  HEPATIC FUNCTION PANEL - Abnormal; Notable for the following:    Total Protein 6.4 (*)    Albumin 3.3 (*)    Total Bilirubin 1.4 (*)    Indirect Bilirubin 1.3 (*)    All other components within normal limits  LACTIC ACID, PLASMA  HCG, QUANTITATIVE, PREGNANCY  LIPASE, BLOOD  PROTIME-INR  APTT  TROPONIN I  ETHANOL  CBC WITH DIFFERENTIAL/PLATELET  URINALYSIS, COMPLETE (UACMP) WITH MICROSCOPIC  CBC WITH DIFFERENTIAL/PLATELET  LACTIC ACID, PLASMA  PREGNANCY, URINE  PREPARE RBC (CROSSMATCH)  TYPE AND SCREEN  ABO/RH   ____________________________________________  EKG   ____________________________________________  RADIOLOGY  IMPRESSION: Mild ethmoid sinus disease bilaterally. No intracranial mass, hemorrhage, or extra-axial fluid collection. Gray-white compartments appear normal.   Electronically Signed   By: Bretta Bang III M.D.   On: 01/11/2017  16:30  ____________________________________________   PROCEDURES  Procedure(s) performed: Procedures Patient of massive vaginal bleeding blood pressure down to 50 systolic and heart rate of 126. Patient got emergency release O- blood 2 units and 1 L fluids. Heart rate is now 100 and blood pressure is also 100 systolic Critical Care performed:   ____________________________________________   INITIAL IMPRESSION / ASSESSMENT AND PLAN / ED COURSE  Pertinent labs & imaging results that were available during my care of the patient were reviewed by me and considered in my medical decision making (see chart for details).        ____________________________________________   FINAL CLINICAL IMPRESSION(S) / ED DIAGNOSES  Final diagnoses:  Vaginal bleeding      NEW MEDICATIONS STARTED DURING THIS VISIT:  New Prescriptions   No medications on file     Note:  This document was prepared using Dragon voice recognition software and may include unintentional dictation errors.    Arnaldo Natal, MD 01/11/17 1541    Arnaldo Natal, MD 01/11/17 [phone]

## 2017-01-11 NOTE — H&P (Signed)
Error disregard  Other H+P on chart

## 2017-01-11 NOTE — ED Notes (Addendum)
Emergency blood transfusion administered 1st unit [id number] of O negative blood started at 1507, witnessed by Morrie SheldonAshley, RN; stopped at 1535 2nd unit [id number] of O negative blood started at 1511, witnessed by Morrie SheldonAshley, RN; stopped at 1540

## 2017-01-11 NOTE — ED Notes (Signed)
Pt repositioned and updated on POC.  Pt verbalized understanding at this time.  Family member at bedside.

## 2017-01-11 NOTE — ED Notes (Signed)
Pt's black rimmed glasses, green hair tie, black hair piece, black sandals, white charger cord, and pink case placed in patient belongings bag.  Pt's cellphone given to patient at this time.

## 2017-01-11 NOTE — ED Notes (Addendum)
Per lab, second redraw of labs hemolyzed.  Lab to come a redraw labs at this time.  EDP notified.

## 2017-01-11 NOTE — ED Notes (Signed)
Date and time results received: 01/11/17 5:13 PM  Test: Lactic Acid Critical Value: 3.8  Name of Provider Notified: Dr. Darnelle CatalanMalinda  Orders Received? Or Actions Taken?: acknowledged

## 2017-01-11 NOTE — ED Triage Notes (Signed)
Pt to ED via POV c/o vaginal bleeding. Pt reports having profuse vaginal bleeding since having a cesarean on 6/12 and a DC 6/18. Pt saturated with blood upon arrival and shortly after began seizing which subsided approximately 3 minutes later. Pt alert and oriented at this time c/o headache and nausea.

## 2017-01-12 ENCOUNTER — Inpatient Hospital Stay: Payer: Medicaid Other | Admitting: Anesthesiology

## 2017-01-12 ENCOUNTER — Encounter: Payer: Self-pay | Admitting: Anesthesiology

## 2017-01-12 ENCOUNTER — Encounter
Admission: EM | Disposition: A | Discharge status: Home / Self Care | Payer: Self-pay | Source: Home / Self Care | Attending: Obstetrics and Gynecology

## 2017-01-12 DIAGNOSIS — Z9889 Other specified postprocedural states: Secondary | ICD-10-CM

## 2017-01-12 HISTORY — PX: ABDOMINAL HYSTERECTOMY: SHX81

## 2017-01-12 LAB — URINALYSIS, COMPLETE (UACMP) WITH MICROSCOPIC
Bilirubin Urine: NEGATIVE
GLUCOSE, UA: NEGATIVE mg/dL
KETONES UR: NEGATIVE mg/dL
NITRITE: NEGATIVE
PH: 6 (ref 5.0–8.0)
Protein, ur: NEGATIVE mg/dL
Specific Gravity, Urine: 1.012 (ref 1.005–1.030)

## 2017-01-12 LAB — CBC
HCT: 27.2 % — ABNORMAL LOW (ref 35.0–47.0)
Hemoglobin: 8.8 g/dL — ABNORMAL LOW (ref 12.0–16.0)
MCH: 24.4 pg — ABNORMAL LOW (ref 26.0–34.0)
MCHC: 32.4 g/dL (ref 32.0–36.0)
MCV: 75.5 fL — AB (ref 80.0–100.0)
PLATELETS: 310 10*3/uL (ref 150–440)
RBC: 3.6 MIL/uL — AB (ref 3.80–5.20)
RDW: 18.7 % — AB (ref 11.5–14.5)
WBC: 8.8 10*3/uL (ref 3.6–11.0)

## 2017-01-12 LAB — BASIC METABOLIC PANEL
Anion gap: 5 (ref 5–15)
BUN: 8 mg/dL (ref 6–20)
CALCIUM: 8.1 mg/dL — AB (ref 8.9–10.3)
CHLORIDE: 111 mmol/L (ref 101–111)
CO2: 25 mmol/L (ref 22–32)
CREATININE: 0.67 mg/dL (ref 0.44–1.00)
Glucose, Bld: 99 mg/dL (ref 65–99)
Potassium: 3.9 mmol/L (ref 3.5–5.1)
SODIUM: 141 mmol/L (ref 135–145)

## 2017-01-12 SURGERY — HYSTERECTOMY, ABDOMINAL
Anesthesia: General | Laterality: Bilateral

## 2017-01-12 MED ORDER — SUGAMMADEX SODIUM 200 MG/2ML IV SOLN
INTRAVENOUS | Status: DC | PRN
  Administered 2017-01-12: 160 mg via INTRAVENOUS

## 2017-01-12 MED ORDER — METHYLENE BLUE 0.5 % INJ SOLN
INTRAVENOUS | Status: AC
  Filled 2017-01-12: qty 20

## 2017-01-12 MED ORDER — PHENYLEPHRINE HCL 10 MG/ML IJ SOLN
INTRAMUSCULAR | Status: AC
  Filled 2017-01-12: qty 1

## 2017-01-12 MED ORDER — DIPHENHYDRAMINE HCL 50 MG/ML IJ SOLN: 12.5000 mg | Freq: Four times a day (QID) | INTRAMUSCULAR | Status: DC | PRN

## 2017-01-12 MED ORDER — LIDOCAINE HCL (CARDIAC) 20 MG/ML IV SOLN
INTRAVENOUS | Status: DC | PRN
  Administered 2017-01-12: 50 mg via INTRAVENOUS

## 2017-01-12 MED ORDER — DEXAMETHASONE SODIUM PHOSPHATE 10 MG/ML IJ SOLN
INTRAMUSCULAR | Status: AC
  Filled 2017-01-12: qty 1

## 2017-01-12 MED ORDER — DIPHENHYDRAMINE HCL 12.5 MG/5ML PO ELIX
12.5000 mg | ORAL_SOLUTION | Freq: Four times a day (QID) | ORAL | Status: DC | PRN
  Filled 2017-01-12: qty 5

## 2017-01-12 MED ORDER — FENTANYL CITRATE (PF) 100 MCG/2ML IJ SOLN
INTRAMUSCULAR | Status: DC | PRN
  Administered 2017-01-12: 50 ug via INTRAVENOUS
  Administered 2017-01-12 (×2): 100 ug via INTRAVENOUS
  Administered 2017-01-12 (×2): 50 ug via INTRAVENOUS

## 2017-01-12 MED ORDER — ROCURONIUM BROMIDE 100 MG/10ML IV SOLN
INTRAVENOUS | Status: DC | PRN
Start: 2017-01-12 — End: 2017-01-12
  Administered 2017-01-12: 30 mg via INTRAVENOUS
  Administered 2017-01-12: 10 mg via INTRAVENOUS
  Administered 2017-01-12: 20 mg via INTRAVENOUS

## 2017-01-12 MED ORDER — BUPIVACAINE LIPOSOME 1.3 % IJ SUSP
INTRAMUSCULAR | Status: AC
  Filled 2017-01-12: qty 20

## 2017-01-12 MED ORDER — MORPHINE SULFATE 2 MG/ML IV SOLN
INTRAVENOUS | Status: DC
  Administered 2017-01-12: 15:00:00 via INTRAVENOUS
  Administered 2017-01-12: 4.7 mg via INTRAVENOUS
  Administered 2017-01-12: 2.97 mg via INTRAVENOUS
  Administered 2017-01-12: 8.63 mL via INTRAVENOUS
  Filled 2017-01-12 (×5): qty 30

## 2017-01-12 MED ORDER — LACTATED RINGERS IV SOLN
INTRAVENOUS | Status: DC
  Administered 2017-01-13: 01:00:00 via INTRAVENOUS

## 2017-01-12 MED ORDER — MIDAZOLAM HCL 2 MG/2ML IJ SOLN
INTRAMUSCULAR | Status: AC
  Filled 2017-01-12: qty 2

## 2017-01-12 MED ORDER — DEXAMETHASONE SODIUM PHOSPHATE 10 MG/ML IJ SOLN
INTRAMUSCULAR | Status: DC | PRN
  Administered 2017-01-12: 10 mg via INTRAVENOUS

## 2017-01-12 MED ORDER — SUGAMMADEX SODIUM 200 MG/2ML IV SOLN
INTRAVENOUS | Status: AC
  Filled 2017-01-12: qty 2

## 2017-01-12 MED ORDER — PROPOFOL 10 MG/ML IV BOLUS
INTRAVENOUS | Status: AC
  Filled 2017-01-12: qty 20

## 2017-01-12 MED ORDER — ACETAMINOPHEN 10 MG/ML IV SOLN
INTRAVENOUS | Status: DC | PRN
  Administered 2017-01-12: 1000 mg via INTRAVENOUS

## 2017-01-12 MED ORDER — FENTANYL CITRATE (PF) 100 MCG/2ML IJ SOLN
25.0000 ug | INTRAMUSCULAR | Status: DC | PRN
  Administered 2017-01-12 (×2): 25 ug via INTRAVENOUS

## 2017-01-12 MED ORDER — NALOXONE HCL 0.4 MG/ML IJ SOLN: 0.4000 mg | INTRAMUSCULAR | Status: DC | PRN

## 2017-01-12 MED ORDER — ONDANSETRON HCL 4 MG/2ML IJ SOLN
INTRAMUSCULAR | Status: AC
  Filled 2017-01-12: qty 2

## 2017-01-12 MED ORDER — SODIUM CHLORIDE 0.9 % IJ SOLN
INTRAMUSCULAR | Status: AC
  Filled 2017-01-12: qty 50

## 2017-01-12 MED ORDER — ONDANSETRON HCL 4 MG PO TABS: 4.0000 mg | ORAL_TABLET | Freq: Four times a day (QID) | ORAL | Status: DC | PRN

## 2017-01-12 MED ORDER — MORPHINE SULFATE 2 MG/ML IV SOLN
INTRAVENOUS | Status: DC
  Administered 2017-01-13: 0.2 mL via INTRAVENOUS
  Administered 2017-01-13: 3.8 mg via INTRAVENOUS
  Administered 2017-01-13: 2.7 mL via INTRAVENOUS
  Filled 2017-01-12: qty 30
  Filled 2017-01-12: qty 25

## 2017-01-12 MED ORDER — SODIUM CHLORIDE 0.9% FLUSH: 9.0000 mL | INTRAVENOUS | Status: DC | PRN

## 2017-01-12 MED ORDER — FENTANYL CITRATE (PF) 100 MCG/2ML IJ SOLN
INTRAMUSCULAR | Status: AC
  Filled 2017-01-12: qty 2

## 2017-01-12 MED ORDER — ROCURONIUM BROMIDE 50 MG/5ML IV SOLN
INTRAVENOUS | Status: AC
  Filled 2017-01-12: qty 1

## 2017-01-12 MED ORDER — BUPIVACAINE HCL (PF) 0.5 % IJ SOLN
INTRAMUSCULAR | Status: AC
  Filled 2017-01-12: qty 30

## 2017-01-12 MED ORDER — SEVOFLURANE IN SOLN
RESPIRATORY_TRACT | Status: AC
  Filled 2017-01-12: qty 250

## 2017-01-12 MED ORDER — SODIUM CHLORIDE 0.9 % IV SOLN
INTRAVENOUS | Status: DC | PRN
  Administered 2017-01-12: 60 mL

## 2017-01-12 MED ORDER — BUPIVACAINE HCL 0.5 % IJ SOLN
INTRAMUSCULAR | Status: DC | PRN
  Administered 2017-01-12: 30 mL

## 2017-01-12 MED ORDER — ONDANSETRON HCL 4 MG/2ML IJ SOLN: 4.0000 mg | Freq: Four times a day (QID) | INTRAMUSCULAR | Status: DC | PRN

## 2017-01-12 MED ORDER — ACETAMINOPHEN 10 MG/ML IV SOLN
INTRAVENOUS | Status: AC
  Filled 2017-01-12: qty 100

## 2017-01-12 MED ORDER — MIDAZOLAM HCL 2 MG/2ML IJ SOLN
INTRAMUSCULAR | Status: DC | PRN
  Administered 2017-01-12: 2 mg via INTRAVENOUS

## 2017-01-12 MED ORDER — ONDANSETRON HCL 4 MG/2ML IJ SOLN: 4.0000 mg | Freq: Once | INTRAMUSCULAR | Status: DC | PRN

## 2017-01-12 MED ORDER — ONDANSETRON HCL 4 MG/2ML IJ SOLN
INTRAMUSCULAR | Status: DC | PRN
  Administered 2017-01-12: 4 mg via INTRAVENOUS

## 2017-01-12 MED ORDER — PROPOFOL 10 MG/ML IV BOLUS
INTRAVENOUS | Status: DC | PRN
  Administered 2017-01-12: 130 mg via INTRAVENOUS

## 2017-01-12 MED ORDER — ONDANSETRON HCL 4 MG/2ML IJ SOLN
4.0000 mg | Freq: Four times a day (QID) | INTRAMUSCULAR | Status: DC | PRN
  Administered 2017-01-12: 4 mg via INTRAVENOUS
  Filled 2017-01-12: qty 2

## 2017-01-12 MED ORDER — SODIUM CHLORIDE 0.9 % IV SOLN
INTRAVENOUS | Status: DC | PRN
  Administered 2017-01-12: 11:00:00 via INTRAVENOUS

## 2017-01-12 MED ORDER — LIDOCAINE HCL (PF) 2 % IJ SOLN
INTRAMUSCULAR | Status: AC
  Filled 2017-01-12: qty 2

## 2017-01-12 SURGICAL SUPPLY — 40 items
CANISTER SUCT 1200ML W/VALVE (MISCELLANEOUS) ×2 IMPLANT
CATH TRAY 16F METER LATEX (MISCELLANEOUS) ×2 IMPLANT
CHLORAPREP W/TINT 26ML (MISCELLANEOUS) ×2 IMPLANT
DRAPE LAP W/FLUID (DRAPES) ×2 IMPLANT
DRAPE UNDER BUTTOCK W/FLU (DRAPES) ×2 IMPLANT
DRSG TELFA 3X8 NADH (GAUZE/BANDAGES/DRESSINGS) ×2 IMPLANT
ELECT BLADE 6.5 EXT (BLADE) ×2 IMPLANT
ELECT CAUTERY BLADE 6.4 (BLADE) ×2 IMPLANT
ELECT REM PT RETURN 9FT ADLT (ELECTROSURGICAL) ×2
ELECTRODE REM PT RTRN 9FT ADLT (ELECTROSURGICAL) ×1 IMPLANT
GAUZE SPONGE 4X4 12PLY STRL (GAUZE/BANDAGES/DRESSINGS) ×2 IMPLANT
GLOVE BIO SURGEON STRL SZ7 (GLOVE) ×2 IMPLANT
GLOVE BIO SURGEON STRL SZ8 (GLOVE) ×2 IMPLANT
GLOVE BIOGEL PI IND STRL 6.5 (GLOVE) ×1 IMPLANT
GLOVE BIOGEL PI INDICATOR 6.5 (GLOVE) ×1
GOWN STRL REUS W/ TWL LRG LVL3 (GOWN DISPOSABLE) ×2 IMPLANT
GOWN STRL REUS W/ TWL XL LVL3 (GOWN DISPOSABLE) ×1 IMPLANT
GOWN STRL REUS W/TWL LRG LVL3 (GOWN DISPOSABLE) ×2
GOWN STRL REUS W/TWL XL LVL3 (GOWN DISPOSABLE) ×1
HEMOSTAT ARISTA ABSORB 3G PWDR (MISCELLANEOUS) ×2 IMPLANT
KIT RM TURNOVER CYSTO AR (KITS) ×2 IMPLANT
LABEL OR SOLS (LABEL) IMPLANT
PACK BASIN MAJOR ARMC (MISCELLANEOUS) ×2 IMPLANT
RETAINER VISCERA MED (MISCELLANEOUS) IMPLANT
SOL PREP PVP 2OZ (MISCELLANEOUS)
SOLUTION PREP PVP 2OZ (MISCELLANEOUS) IMPLANT
SPONGE XRAY 4X4 16PLY STRL (MISCELLANEOUS) ×2 IMPLANT
STAPLER INSORB 30 2030 C-SECTI (MISCELLANEOUS) ×2 IMPLANT
STAPLER SKIN PROX 35W (STAPLE) ×2 IMPLANT
SURGILUBE 2OZ TUBE FLIPTOP (MISCELLANEOUS) ×2 IMPLANT
SUT PDS AB 1 TP1 96 (SUTURE) IMPLANT
SUT VIC AB 0 CT1 27 (SUTURE) ×3
SUT VIC AB 0 CT1 27XCR 8 STRN (SUTURE) ×3 IMPLANT
SUT VIC AB 0 CT1 36 (SUTURE) ×4 IMPLANT
SUT VIC AB 2-0 SH 27 (SUTURE) ×4
SUT VIC AB 2-0 SH 27XBRD (SUTURE) ×4 IMPLANT
SUT VICRYL PLUS ABS 0 54 (SUTURE) ×2 IMPLANT
SYR BULB IRRIG 60ML STRL (SYRINGE) ×2 IMPLANT
TRAY PREP VAG/GEN (MISCELLANEOUS) ×2 IMPLANT
WATER STERILE IRR 1000ML POUR (IV SOLUTION) IMPLANT

## 2017-01-12 NOTE — Progress Notes (Signed)
Pt is ready for TAH / bilateral salpingectomy  Npo  HCT 27  I will request an additional unit of blood intraop  All questions answered  Proceed

## 2017-01-12 NOTE — Anesthesia Postprocedure Evaluation (Signed)
Anesthesia Post Note  Patient: Charlene Key  Procedure(s) Performed: Procedure(s) (LRB): HYSTERECTOMY ABDOMINAL BILATERAL SALPINGECTOMY (Bilateral)  Patient location during evaluation: PACU Anesthesia Type: General Level of consciousness: awake and alert Pain management: pain level controlled Vital Signs Assessment: post-procedure vital signs reviewed and stable Respiratory status: spontaneous breathing, nonlabored ventilation, respiratory function stable and patient connected to nasal cannula oxygen Cardiovascular status: blood pressure returned to baseline and stable Postop Assessment: no signs of nausea or vomiting Anesthetic complications: no     Last Vitals:  Vitals:   01/12/17 1300 01/12/17 1333  BP: (!) 140/91 130/77  Pulse:  86  Resp: 16 16  Temp:  37.6 C    Last Pain:  Vitals:   01/12/17 1333  TempSrc: Oral  PainSc:                  Charlene MccreedyJoseph K Roselia Key

## 2017-01-12 NOTE — Brief Op Note (Signed)
01/11/2017 - 01/12/2017  12:15 PM  PATIENT:  Charlene Key  26 y.o. female  PRE-OPERATIVE DIAGNOSIS:  Second acute uterine hemorrhage without etiology   POST-OPERATIVE DIAGNOSIS:  postpartum bleeding, rule out accreta   PROCEDURE:  Procedure(s): HYSTERECTOMY ABDOMINAL BILATERAL SALPINGECTOMY (Bilateral)  SURGEON:  Surgeon(s) and Role:    * Elexis Pollak, Ihor Austinhomas J, MD - Primary    * Ward, Elenora Fenderhelsea C, MD - Assisting  PHYSICIAN ASSISTANT:   ASSISTANTS: none   ANESTHESIA:   general  EBL:  Total I/O In: 1907.1 [I.V.:1627.1; Blood:280] Out: 850 [Urine:500; Blood:350]  BLOOD ADMINISTERED:280 CC PRBC  DRAINS: Urinary Catheter (Foley)   LOCAL MEDICATIONS USED:  MARCAINE   , BUPIVICAINE  NS solution , Amount: 90 ml   SPECIMEN:  Source of Specimen:  uterus tagged at anterior wall, bilateral fallopian tubes   DISPOSITION OF SPECIMEN:  PATHOLOGY  COUNTS:  YES  TOURNIQUET:  * No tourniquets in log *  DICTATION: .Other Dictation: Dictation Number verbal  PLAN OF CARE: Admit to inpatient   PATIENT DISPOSITION:  PACU - hemodynamically stable.   Delay start of Pharmacological VTE agent (>24hrs) due to surgical blood loss or risk of bleeding: not applicable

## 2017-01-12 NOTE — Progress Notes (Signed)
Patient ID: [place], female   DOB: Sep 13, 1990, 26 y.o.   MRN: [id number] Post op   foley removed because of irritation . + rectal pressure . PCA with control of pain   VSS Stable  Cont PCA and IVF and NPO status  Dr ward to cover me in my absence

## 2017-01-12 NOTE — Discharge Summary (Signed)
Physician Discharge Summary  Patient ID: [place] Link MRN: [id number] DOB/AGE: [birth year] 28 y.o.  Admit date: 01/11/2017 Discharge date: 01/13/2017  Admission Diagnoses:AUB , postpartum  Discharge Diagnoses: same , possible accreta Active Problems:   Abnormal uterine bleeding, postpartum   Post-operative state   Discharged Condition: good  Hospital Course: admitted with significant hemorrhage and sycopal episode and resulting Sz activity . 2 unit blood transfusion with stabalization . HD#2 underwent a TAH , bilateral salpingectomy, received additional unit PRBC in OR, H/H stable after surgery, patient stable for D/C on HD 3/POD 1.   Consults: None  Discharge Exam: Blood pressure 121/71, pulse 79, temperature 98.4 F (36.9 C), temperature source Oral, resp. rate 16, height 5\' 3"  (1.6 m), weight 180 lb (81.6 kg), SpO2 100 %. GEN: NAD, comfortable CV: RRR no murmurs PULM: CTABL, normal respiratory effort ABD: soft, nd, nt, INCISION: c/d/i no induration, erythema, ecchymoses, or discharge LE: no cords, edema, tenderness to palpation  Disposition: 01-Home or Self Care  Discharge Instructions    Diet - low sodium heart healthy    Complete by:  As directed    Increase activity slowly    Complete by:  As directed      Allergies as of 01/13/2017   No Known Allergies     Medication List    TAKE these medications   acetaminophen 325 MG tablet Commonly known as:  TYLENOL Take 650 mg by mouth every 6 (six) hours as needed. Notes to patient:  Make sure to space doses by 6 hours.    docusate sodium 100 MG capsule Commonly known as:  COLACE Take 100 mg by mouth 2 (two) times daily as needed.   ibuprofen 600 MG tablet Commonly known as:  ADVIL,MOTRIN Take 1 tablet (600 mg total) by mouth every 6 (six) hours. Notes to patient:  Make sure to space doses by 6 hours   oxyCODONE 5 MG immediate release tablet Commonly known as:  ROXICODONE Take 1 tablet (5 mg total) by mouth every 4  (four) hours as needed. Notes to patient:  Make sure to space doses by at least 4 hours.   prenatal multivitamin Tabs tablet Take 1 tablet by mouth daily.      Follow-up Information    Schermerhorn, Ihor Austinhomas J, MD Follow up in 2 week(s).   Specialty:  Obstetrics and Gynecology Why:  Call the office tomorrow (01/14/17) to schedule a "post op check" and ask to be seen within the next 2 weeks.  Contact information: [address] [facility] [place] [id number] [phone]           Signed ----- Ranae Plumberhelsea Ward, MD Attending Obstetrician and Gynecologist [facility], Department of OB/GYN [facility]

## 2017-01-12 NOTE — Transfer of Care (Signed)
Immediate Anesthesia Transfer of Care Note  Patient: Charlene Key  Procedure(s) Performed: Procedure(s): HYSTERECTOMY ABDOMINAL BILATERAL SALPINGECTOMY (Bilateral)  Patient Location: PACU  Anesthesia Type:General  Level of Consciousness: awake, alert  and oriented  Airway & Oxygen Therapy: Patient Spontanous Breathing and Patient connected to face mask oxygen  Post-op Assessment: Report given to RN and Post -op Vital signs reviewed and stable  Post vital signs: Reviewed and stable  Last Vitals:  Vitals:   01/12/17 0842 01/12/17 1235  BP: 135/81 124/62  Pulse: 75 85  Resp: 18 17  Temp: 36.6 C 36.4 C    Last Pain:  Vitals:   01/12/17 1235  TempSrc:   PainSc: 0-No pain         Complications: No apparent anesthesia complications

## 2017-01-12 NOTE — Anesthesia Preprocedure Evaluation (Addendum)
Anesthesia Evaluation  Patient identified by MRN, date of birth, ID band Patient awake    Reviewed: Allergy & Precautions, NPO status , Patient's Chart, lab work & pertinent test results  Airway Mallampati: II  TM Distance: >3 FB     Dental  (+) Caps, Chipped   Pulmonary neg pulmonary ROS,    Pulmonary exam normal        Cardiovascular negative cardio ROS Normal cardiovascular exam     Neuro/Psych negative neurological ROS  negative psych ROS   GI/Hepatic negative GI ROS, Neg liver ROS,   Endo/Other  negative endocrine ROS  Renal/GU negative Renal ROS  Female GU complaint     Musculoskeletal negative musculoskeletal ROS (+)   Abdominal Normal abdominal exam  (+)   Peds negative pediatric ROS (+)  Hematology negative hematology ROS (+)   Anesthesia Other Findings   Reproductive/Obstetrics                            Anesthesia Physical Anesthesia Plan  ASA: II  Anesthesia Plan: General   Post-op Pain Management:    Induction: Intravenous  PONV Risk Score and Plan: 4 or greater and Ondansetron, Dexamethasone, Propofol, Midazolam, Scopolamine patch - Pre-op and Treatment may vary due to age or medical condition  Airway Management Planned: Oral ETT  Additional Equipment:   Intra-op Plan:   Post-operative Plan: Extubation in OR  Informed Consent: I have reviewed the patients History and Physical, chart, labs and discussed the procedure including the risks, benefits and alternatives for the proposed anesthesia with the patient or authorized representative who has indicated his/her understanding and acceptance.   Dental advisory given  Plan Discussed with: CRNA and Surgeon  Anesthesia Plan Comments:         Anesthesia Quick Evaluation

## 2017-01-12 NOTE — Anesthesia Post-op Follow-up Note (Cosign Needed)
Anesthesia QCDR form completed.        

## 2017-01-12 NOTE — Anesthesia Procedure Notes (Signed)
Procedure Name: Intubation Date/Time: 01/12/2017 9:34 AM Performed by: Jonna Clark Pre-anesthesia Checklist: Patient identified, Patient being monitored, Timeout performed, Emergency Drugs available and Suction available Patient Re-evaluated:Patient Re-evaluated prior to inductionOxygen Delivery Method: Circle system utilized Preoxygenation: Pre-oxygenation with 100% oxygen Intubation Type: IV induction Ventilation: Mask ventilation without difficulty Laryngoscope Size: Mac and 3 Grade View: Grade I Tube type: Oral Tube size: 7.0 mm Number of attempts: 1 Placement Confirmation: ETT inserted through vocal cords under direct vision,  positive ETCO2 and breath sounds checked- equal and bilateral Secured at: 21 cm Tube secured with: Tape Dental Injury: Teeth and Oropharynx as per pre-operative assessment

## 2017-01-13 ENCOUNTER — Encounter: Payer: Self-pay | Admitting: Obstetrics and Gynecology

## 2017-01-13 LAB — BASIC METABOLIC PANEL
ANION GAP: 6 (ref 5–15)
BUN: 7 mg/dL (ref 6–20)
CALCIUM: 8.8 mg/dL — AB (ref 8.9–10.3)
CHLORIDE: 105 mmol/L (ref 101–111)
CO2: 28 mmol/L (ref 22–32)
Creatinine, Ser: 0.66 mg/dL (ref 0.44–1.00)
GFR calc Af Amer: >60 mL/min (ref >60)
GFR calc non Af Amer: >60 mL/min (ref >60)
GLUCOSE: 96 mg/dL (ref 65–99)
Potassium: 4.1 mmol/L (ref 3.5–5.1)
Sodium: 139 mmol/L (ref 135–145)

## 2017-01-13 LAB — CBC
HEMATOCRIT: 27.6 % — AB (ref 35.0–47.0)
HEMOGLOBIN: 8.9 g/dL — AB (ref 12.0–16.0)
MCH: 24.9 pg — ABNORMAL LOW (ref 26.0–34.0)
MCHC: 32.2 g/dL (ref 32.0–36.0)
MCV: 77.2 fL — AB (ref 80.0–100.0)
Platelets: 315 10*3/uL (ref 150–440)
RBC: 3.57 MIL/uL — ABNORMAL LOW (ref 3.80–5.20)
RDW: 19.4 % — AB (ref 11.5–14.5)
WBC: 13.1 10*3/uL — AB (ref 3.6–11.0)

## 2017-01-13 MED ORDER — IBUPROFEN 600 MG PO TABS: 600.0000 mg | ORAL_TABLET | Freq: Four times a day (QID) | ORAL | 1 refills | Status: DC

## 2017-01-13 MED ORDER — OXYCODONE HCL 5 MG PO TABS: 5.0000 mg | ORAL_TABLET | ORAL | 0 refills | Status: AC | PRN

## 2017-01-13 MED ORDER — ACETAMINOPHEN 500 MG PO TABS
1000.0000 mg | ORAL_TABLET | Freq: Four times a day (QID) | ORAL | Status: DC
  Administered 2017-01-13 (×2): 1000 mg via ORAL
  Filled 2017-01-13 (×2): qty 2

## 2017-01-13 MED ORDER — DOCUSATE SODIUM 100 MG PO CAPS
100.0000 mg | ORAL_CAPSULE | Freq: Two times a day (BID) | ORAL | Status: DC | PRN
  Administered 2017-01-13: 100 mg via ORAL
  Filled 2017-01-13: qty 1

## 2017-01-13 MED ORDER — IBUPROFEN 600 MG PO TABS
600.0000 mg | ORAL_TABLET | Freq: Four times a day (QID) | ORAL | Status: DC
  Administered 2017-01-13 (×2): 600 mg via ORAL
  Filled 2017-01-13 (×2): qty 1

## 2017-01-13 NOTE — Progress Notes (Signed)
Per Dr. Elesa MassedWard pt could be discharged today if she tolerated her diet and started passing gas. Per pt she has started passing gas and denies any more nausea or emesis. Pt tolerated her lunch tray as well. Pt states that she feels ready to go home. Dr. Elesa MassedWard called and she states pt may be discharged, and to remind pt to call and schedule an appointment in the next 2 weeks or sooner if needed.

## 2017-01-13 NOTE — Discharge Instructions (Signed)
Discharge instructions:  °Call office if you have any of the following: fever >101 F, chills, excessive vaginal bleeding, incision drainage or problems, leg pain or redness, or any other concerns.  ° °Activity: Do not lift > 10 lbs for 8 weeks.  °No intercourse or tampons for 8 weeks.  °No driving for 1-2 weeks.  ° °Take 600mg Ibuprofen and 1000mg Tylenol around the clock, every 6 hours for at least the first 3-5 days.  After this you can take as needed.  This will help decrease inflammation and promote healing.  The narcotics you'll take just as needed, as they just trick your brain into thinking its not in pain.   ° °Please don't limit yourself in terms of routine activity.  You will be able to do most things, although they may take longer to do or be a little painful.  You can do it! ° °Don't be a hero, but don't be a wimp either!  ° °

## 2017-01-13 NOTE — Progress Notes (Signed)
Patient is to be discharged home today. Patient is in no acute distress at this time, and assessment is unchanged from this morning. Patient's IV is out, discharge paperwork has been discussed with patient/family and there are no questions or concerns at this time. Patient will be accompanied downstairs by staff and family via wheelchair.   

## 2017-01-13 NOTE — Progress Notes (Addendum)
Obstetric and Gynecology  POD/HD # 3  Subjective  Patient doing well, no complaints, tolerating PO intake, tolerating pain with PO meds, ambulating without difficulty, voiding spontaneously.   Had one emesis early this morning but none since.  Says she is nauseous with standing, but otherwise fine.   Denies CP, SOB, F/C, N/V/D, or leg pain.   Objective  Objective:   Vitals:   01/13/17 0316 01/13/17 0345 01/13/17 0525 01/13/17 0737  BP: 113/72   139/81  Pulse: 70   74  Resp: _0 Temp: 98.6 F (37 C)   98.6 F (37 C)  TempSrc: Oral   Oral  SpO2: 98% 100% 100% 93%  Weight:      Height:        Intake/Output Summary (Last 24 hours) at 01/13/17 1055 Last data filed at 01/13/17 0525  Gross per 24 hour  Intake          1869.17 ml  Output             3120 ml  Net         -1250.83 ml    General: NAD Cardiovascular: RRR, no murmurs Pulmonary: CTAB, normal respiratory effort Abdomen: Benign. Non-tender, +BS, no guarding. Incision: bandaged, c/d Extremities: No erythema or cords, no calf tenderness, with normal peripheral pulses.  Labs: Results for orders placed or performed during the hospital encounter of 01/11/17 (from the past 24 hour(s))  CBC     Status: Abnormal   Collection Time: 01/13/17  8:23 AM  Result Value Ref Range   WBC 13.1 (H) 3.6 - 11.0 K/uL   RBC 3.57 (L) 3.80 - 5.20 MIL/uL   Hemoglobin 8.9 (L) 12.0 - 16.0 g/dL   HCT 27.6 (L) 35.0 - 47.0 %   MCV 77.2 (L) 80.0 - 100.0 fL   MCH 24.9 (L) 26.0 - 34.0 pg   MCHC 32.2 32.0 - 36.0 g/dL   RDW 19.4 (H) 11.5 - 14.5 %   Platelets 315 150 - 440 K/uL  Basic metabolic panel     Status: Abnormal   Collection Time: 01/13/17  8:23 AM  Result Value Ref Range   Sodium 139 135 - 145 mmol/L   Potassium 4.1 3.5 - 5.1 mmol/L   Chloride 105 101 - 111 mmol/L   CO2 28 22 - 32 mmol/L   Glucose, Bld 96 65 - 99 mg/dL   BUN 7 6 - 20 mg/dL   Creatinine, Ser 0.66 0.44 - 1.00 mg/dL   Calcium 8.8 (L) 8.9 - 10.3 mg/dL    GFR calc non Af Amer >60 >60 mL/min   GFR calc Af Amer >60 >60 mL/min   Anion gap 6 5 - 15    Cultures: Results for orders placed or performed during the hospital encounter of 10/16/16  Urine culture     Status: Abnormal   Collection Time: 10/16/16  7:21 AM  Result Value Ref Range Status   Specimen Description URINE, RANDOM  Final   Special Requests NONE  Final   Culture (A)  Final    <10,000 COLONIES/mL INSIGNIFICANT GROWTH NO GROUP B STREP (S.AGALACTIAE) ISOLATED Performed at [facility], [address], [place], [place] [address]    Report Status 10/17/2016 FINAL  Final    Imaging: Ct Head Wo Contrast  Result Date: 01/11/2017 CLINICAL DATA:  Seizure.  Headache and nausea. EXAM: CT HEAD WITHOUT CONTRAST TECHNIQUE: Contiguous axial images were obtained from the base of the skull through the  vertex without intravenous contrast. COMPARISON:  None. FINDINGS: Brain: The ventricles are normal in size and configuration. There is no intracranial mass, hemorrhage, extra-axial fluid collection, or midline shift. Gray-white compartments are normal. No evident acute infarct. Vascular: No hyperdense vessel. No appreciable vascular calcification. Skull: Bony calvarium appears intact. Sinuses/Orbits: There is mild mucosal thickening in several ethmoid air cells bilaterally. Other visualized paranasal sinuses are clear. Visualized orbits appear symmetric bilaterally. Other: Mastoid air cells are clear. IMPRESSION: Mild ethmoid sinus disease bilaterally. No intracranial mass, hemorrhage, or extra-axial fluid collection. Gray-white compartments appear normal. Electronically Signed   By: Lowella Grip III M.D.   On: 01/11/2017 16:30   US Pelvis Complete  Result Date: 01/11/2017 CLINICAL DATA:  Heavy vaginal bleeding. Post Caesarean section 12/22/2016. Post D&C 12/28/2016 EXAM: TRANSABDOMINAL ULTRASOUND OF PELVIS TECHNIQUE: Transabdominal ultrasound examination of the pelvis was performed including  evaluation of the uterus, ovaries, adnexal regions, and pelvic cul-de-sac. COMPARISON:  None. FINDINGS: Uterus Measurements: 15.5 x 5.6 x 8.5 cm. No fibroids or other mass visualized. Endometrium Thickness: 16.1 mm, heterogeneous echogenicity. No definite endometrial vascularity or focal mass. Right ovary Measurements: 3.0 x 1.1 x 1.6 cm. Normal appearance/no adnexal mass. Left ovary Measurements: 3.3 x 1.6 x 1.5 cm. Normal appearance/no adnexal mass. Other findings:  No abnormal free fluid. IMPRESSION: Heterogeneous endometrial thickening of 16 mm without endometrial vascularity or focal mass. Findings are nonspecific and could indicate endometrial blood products, although hypovascular retained products of conception cannot be excluded. Consider short-term follow-up pelvic ultrasound or further evaluation with pelvic MRI with and without IV contrast, as clinically warranted. Electronically Signed   By: Jeb Levering M.D.   On: 01/11/2017 19:51     Assessment   26 y.o. 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  Plan   1. Postop - doing excellent, continue routine post op care.  Scheduled tylenol/ibuprofen 2. Nausea with standing: likely effect of anesthesia, but could be due to anemia.  Her baseline Hb looks like it lives around 10, and she is currently 8.9 after surgery + 1 PRBC intraop.  If this continues and is not responsive to time and anti-emetics, will consider another unit. 3.  If all milestones are met and pain control continues to be excellent (currently 2/10 on only tylenol/motrin) then I will consider d/c this evening.  ----- Larey Days, MD Attending Obstetrician and Gynecologist [facility], Department of [facility]

## 2017-01-14 LAB — BPAM RBC
BLOOD PRODUCT EXPIRATION DATE: 201807112359
BLOOD PRODUCT EXPIRATION DATE: 201807112359
Blood Product Expiration Date: 201807102359
Blood Product Expiration Date: 201807102359
Blood Product Expiration Date: 201807192359
ISSUE DATE / TIME: 201807021452
ISSUE DATE / TIME: 201807021452
ISSUE DATE / TIME: 201807031010
UNIT TYPE AND RH: 9500
UNIT TYPE AND RH: 9500
UNIT TYPE AND RH: 9500
UNIT TYPE AND RH: 9500
Unit Type and Rh: 9500

## 2017-01-14 LAB — TYPE AND SCREEN
ABO/RH(D): O NEG
Antibody Screen: POSITIVE
UNIT DIVISION: 0
Unit division: 0
Unit division: 0
Unit division: 0
Unit division: 0

## 2017-01-14 NOTE — Op Note (Signed)
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  PROCEDURE PERFORMED: 1. Total abdominal hysterectomy. 2. Bilateral salpingectomy.  SURGEON:  Jennell Cornerhomas Anzel Kearse, MD  ANESTHESIA:  General endotracheal anesthesia.  SURGEON:  Jennell Cornerhomas Afrika Brick, MD.  FIRST ASSISTANT:  Elenora Fenderhelsea C Ward, MD.  INDICATION:  This is a [age] year old gravida 9, para 8.  The patient presented to the emergency department the day before the procedure with acute onset of heavy uterine bleeding.  The patient had a syncopal episode and seizure activity resulting from severe anemia.  The patient was given 2 units of packed red blood cells and stabilized.  The patient is 3 weeks out from a repeat cesarean section.  The preceding cesarean section was a classical cesarean section.  The patient presented 5 days after her initial repeat cesarean section with similar episode of acute onset of heavy bleeding and required a blood transfusion, and she underwent a suction, dilation, and curettage, which failed to reveal significant retained tissue.  DESCRIPTION OF PROCEDURE:  After adequate general endotracheal anesthesia, the patient was placed in dorsal supine position, legs in the [place] stirrups.  SCDs were placed on her legs.  The patient's abdomen was prepped and draped in normal sterile fashion.  Cefoxitin 2 g was administered prior to commencement of the case.  Time-out was performed.  Foley catheter was placed.  A Pfannenstiel incision was made 2 fingerbreadths above the  symphysis pubis.  Sharp dissection was used to identify the fascia.  Fascia was opened in the midline and opened in a transverse fashion.  The anterior fascia was grasped with Kocher clamps and the recti muscles dissected free.  Inferior aspect of the fascia was grasped with Kocher clamps and pyramidalis muscle was dissected free.  Entry into the peritoneal cavity was accomplished sharply.  An O'Connor-O'Sullivan retractor was placed in the abdomen and the bowel was packed cephalad.  Two large Kelly clamps were placed on each cornua and used for retraction.  The round ligaments on both sides were clamped, transected, and suture ligated with 0 Vicryl suture. Anterior leaf of the broad ligament was incised along the bladder reflection.  There were dense adhesions.  Meticulous dissection was used to ultimately free this area.  Bladder was then gently dissected off the lower uterine segment with sharp and blunt dissection.  The utero- ovarian ligaments were then bilaterally clamped after opening the broad ligament.  Pedicles were transected, doubly ligated with 0 Vicryl suture.  The course of each ureter was identified prior to skeletonizing each uterine artery.  Uterine arteries were then clamped, transected, and suture ligated with 0 Vicryl suture.  Straight Haney clamps were used to continue to clamp the cardinal ligaments.  These pedicles were transected and suture  ligated.  Curved Haney clamps were used to secure the vaginal angles and the cervix and uterus were removed intact.  The angles were closed with 0 Vicryl sutures and interrupted 0 Vicryl suture was used to close remaining vaginal cuff.  Normal peristaltic activity visualized of each ureter after the removal of the cervix and uterus. Pelvis was copiously irrigated after removing all sponges.  Good hemostasis was noted.  Arista was used to control small amount of oozing in the pelvic sidewalls.  Fascia was then closed with 0  Vicryl suture in a running nonlocking fashion.  Fascia was then injected with a solution of 20 mL of 1.3% bupivacaine with 30 mL of 0.5% Marcaine in 50 mL normal saline, 60 mL was used of this solution for injection.  Subcutaneous tissues were irrigated and bovied.  Skin was reapproximated with Insorb absorbable staples and the skin was injected with 30 mL of the bupivacaine solution.  There were no complications.  The patient tolerated the procedure well.  ESTIMATED BLOOD LOSS:  350 mL.  URINE OUTPUT:  500 mL.  INTRAOPERATIVE FLUIDS:  1400 mL plus an additional 280 mL of packed red blood cells.  DISPOSITION:  The patient was taken to recovery room in good condition.          ______________________________ Jennell Corner, MD     TS/MEDQ  D:  01/12/2017  T:  01/12/2017  Job:  [id number]

## 2017-01-15 LAB — SURGICAL PATHOLOGY

## 2017-05-17 ENCOUNTER — Encounter (HOSPITAL_COMMUNITY): Payer: Self-pay

## 2017-09-25 ENCOUNTER — Encounter (HOSPITAL_COMMUNITY): Payer: Self-pay

## 2017-09-25 ENCOUNTER — Emergency Department (HOSPITAL_COMMUNITY)
Admission: EM | Admit: 2017-09-25 | Discharge: 2017-09-25 | Disposition: A | Discharge status: Home / Self Care | Payer: Self-pay | Attending: Emergency Medicine | Admitting: Emergency Medicine

## 2017-09-25 ENCOUNTER — Emergency Department (HOSPITAL_COMMUNITY): Payer: Self-pay

## 2017-09-25 ENCOUNTER — Other Ambulatory Visit: Payer: Self-pay

## 2017-09-25 DIAGNOSIS — J9801 Acute bronchospasm: Secondary | ICD-10-CM | POA: Insufficient documentation

## 2017-09-25 DIAGNOSIS — J209 Acute bronchitis, unspecified: Secondary | ICD-10-CM

## 2017-09-25 DIAGNOSIS — R059 Cough, unspecified: Secondary | ICD-10-CM

## 2017-09-25 DIAGNOSIS — R05 Cough: Secondary | ICD-10-CM

## 2017-09-25 MED ORDER — IPRATROPIUM BROMIDE 0.02 % IN SOLN
0.5000 mg | Freq: Once | RESPIRATORY_TRACT | Status: AC
  Administered 2017-09-25: 0.5 mg via RESPIRATORY_TRACT
  Filled 2017-09-25: qty 2.5

## 2017-09-25 MED ORDER — ALBUTEROL SULFATE (2.5 MG/3ML) 0.083% IN NEBU
5.0000 mg | INHALATION_SOLUTION | Freq: Once | RESPIRATORY_TRACT | Status: AC
  Administered 2017-09-25: 5 mg via RESPIRATORY_TRACT
  Filled 2017-09-25: qty 6

## 2017-09-25 MED ORDER — IPRATROPIUM BROMIDE 0.02 % IN SOLN: 0.5000 mg | Freq: Once | RESPIRATORY_TRACT | Status: DC

## 2017-09-25 MED ORDER — ALBUTEROL (5 MG/ML) CONTINUOUS INHALATION SOLN: 10.0000 mg/h | INHALATION_SOLUTION | Freq: Once | RESPIRATORY_TRACT | Status: DC

## 2017-09-25 MED ORDER — AMOXICILLIN 500 MG PO CAPS: 500.0000 mg | ORAL_CAPSULE | Freq: Three times a day (TID) | ORAL | 0 refills | Status: DC

## 2017-09-25 MED ORDER — ALBUTEROL SULFATE HFA 108 (90 BASE) MCG/ACT IN AERS
2.0000 | INHALATION_SPRAY | Freq: Four times a day (QID) | RESPIRATORY_TRACT | Status: DC | PRN
  Administered 2017-09-25: 2 via RESPIRATORY_TRACT
  Filled 2017-09-25: qty 6.7

## 2017-09-25 MED ORDER — AEROCHAMBER Z-STAT PLUS/MEDIUM MISC
Status: AC
  Filled 2017-09-25: qty 1

## 2017-09-25 MED ORDER — IPRATROPIUM-ALBUTEROL 0.5-2.5 (3) MG/3ML IN SOLN
3.0000 mL | Freq: Once | RESPIRATORY_TRACT | Status: AC
  Administered 2017-09-25: 3 mL via RESPIRATORY_TRACT
  Filled 2017-09-25: qty 3

## 2017-09-25 NOTE — ED Triage Notes (Signed)
Pt states she has had a productive cough x 3 weeks and congestion in her chest.  Pt nad

## 2017-09-25 NOTE — ED Provider Notes (Signed)
[facility] EMERGENCY DEPARTMENT Provider Note   CSN: [id number] Arrival date & time: 09/25/17  0056  Time seen 04:50 AM   History   Chief Complaint Chief Complaint  Patient presents with  . Shortness of Breath    HPI Charlene Key Charlene Key is a 27 y.o. female.  HPI patient states she started having URI symptoms about 3 weeks ago.  She states she is coughing and coughing up mucus but she does look at it so she does not know what color it is.  She has had rhinorrhea that is clear and sneezing.  She denies sore throat, nausea, vomiting, or diarrhea.  She states she feels short of breath and she has been wheezing.  She has had a inhaler in the past but she does not have one now.  She states her last flareup was over a year ago.  She states they normally do not put her on steroids.  She has not been around anyone who is sick, she denies taking the flu shot this season.  PCP Patient, No Pcp Per   Past Medical History:  Diagnosis Date  . Medical history non-contributory     Patient Active Problem List   Diagnosis Date Noted  . Post-operative state 01/12/2017  . Abnormal uterine bleeding, postpartum 01/11/2017  . History of herpes genitalis 10/27/2016  . Pregnancy 10/16/2016    Past Surgical History:  Procedure Laterality Date  . ABDOMINAL HYSTERECTOMY Bilateral 01/12/2017   Procedure: HYSTERECTOMY ABDOMINAL BILATERAL SALPINGECTOMY;  Surgeon: Schermerhorn, Ihor Austin, MD;  Location: [facility] ORS;  Service: Gynecology;  Laterality: Bilateral;  . CESAREAN SECTION      OB History    Gravida Para Term Preterm AB Living   7 6 5 1   6    SAB TAB Ectopic Multiple Live Births         1 7       Home Medications    Prior to Admission medications   Medication Sig Start Date End Date Taking? Authorizing Provider  acetaminophen (TYLENOL) 325 MG tablet Take 650 mg by mouth every 6 (six) hours as needed. 12/24/16   [provider]  amoxicillin (AMOXIL) 500 MG capsule Take 1 capsule (500 mg  total) by mouth 3 (three) times daily. 09/25/17   Devoria Albe, MD  ibuprofen (ADVIL,MOTRIN) 600 MG tablet Take 1 tablet (600 mg total) by mouth every 6 (six) hours. 01/13/17   Ward, Elenora Fender, MD  oxyCODONE (ROXICODONE) 5 MG immediate release tablet Take 1 tablet (5 mg total) by mouth every 4 (four) hours as needed. 01/13/17 01/13/18  Ward, Elenora Fender, MD  Prenatal Vit-Fe Fumarate-FA (PRENATAL MULTIVITAMIN) TABS tablet Take 1 tablet by mouth daily.    [provider]    Family History No family history on file.  Social History Social History   Tobacco Use  . Smoking status: Never Smoker  . Smokeless tobacco: Never Used  Substance Use Topics  . Alcohol use: No  . 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   Review of Systems Review of Systems  All other systems reviewed and are negative.    Physical Exam Updated Vital Signs BP 134/89 (BP Location: Right Arm)   Pulse 89   Temp 98.6 F (37 C) (Oral)   Resp (!) 22   Ht 5\' 3"  (1.6 m)   Wt 90.7 kg (200 lb)   LMP 06/10/2015   SpO2 98%   BMI 35.43  kg/m   Physical Exam  Constitutional: She is oriented to person, place, and time. She appears well-developed and well-nourished.  Non-toxic appearance. She does not appear ill. No distress.  HENT:  Head: Normocephalic and atraumatic.  Right Ear: External ear normal.  Left Ear: External ear normal.  Nose: Nose normal. No mucosal edema or rhinorrhea.  Mouth/Throat: Oropharynx is clear and moist and mucous membranes are normal. No dental abscesses or uvula swelling.  Eyes: Conjunctivae and EOM are normal. Pupils are equal, round, and reactive to light.  Neck: Normal range of motion and full passive range of motion without pain. Neck supple.  Cardiovascular: Normal rate, regular rhythm and normal heart sounds. Exam reveals no gallop and no friction rub.  No murmur heard. Pulmonary/Chest: Effort normal. No accessory muscle usage. No  tachypnea. No respiratory distress. She has decreased breath sounds. She has wheezes. She has rhonchi. She has no rales. She exhibits no tenderness and no crepitus.  Abdominal: Soft. Normal appearance and bowel sounds are normal. She exhibits no distension. There is no tenderness. There is no rebound and no guarding.  Musculoskeletal: Normal range of motion. She exhibits no edema or tenderness.  Moves all extremities well.   Neurological: She is alert and oriented to person, place, and time. She has normal strength. No cranial nerve deficit.  Skin: Skin is warm, dry and intact. No rash noted. No erythema. No pallor.  Psychiatric: She has a normal mood and affect. Her speech is normal and behavior is normal. Her mood appears not anxious.  Nursing note and vitals reviewed.    ED Treatments / Results  Labs (all labs ordered are listed, but only abnormal results are displayed) Labs Reviewed - No data to display  EKG  EKG Interpretation None       Radiology Dg Chest 2 View  Result Date: 09/25/2017 CLINICAL DATA:  Cough and congestion EXAM: CHEST - 2 VIEW COMPARISON:  None. FINDINGS: The heart size and mediastinal contours are within normal limits. Both lungs are clear. The visualized skeletal structures are unremarkable. IMPRESSION: Normal chest. Electronically Signed   By: Deatra Robinson M.D.   On: 09/25/2017 01:55    Procedures Procedures (including critical care time)  Medications Ordered in ED Medications  albuterol (PROVENTIL HFA;VENTOLIN HFA) 108 (90 Base) MCG/ACT inhaler 2 puff (not administered)  ipratropium-albuterol (DUONEB) 0.5-2.5 (3) MG/3ML nebulizer solution 3 mL (3 mLs Nebulization Given 09/25/17 0516)  albuterol (PROVENTIL) (2.5 MG/3ML) 0.083% nebulizer solution 5 mg (5 mg Nebulization Given 09/25/17 0626)  ipratropium (ATROVENT) nebulizer solution 0.5 mg (0.5 mg Nebulization Given 09/25/17 0626)     Initial Impression / Assessment and Plan / ED Course  I have  reviewed the triage vital signs and the nursing notes.  Pertinent labs & imaging results that were available during my care of the patient were reviewed by me and considered in my medical decision making (see chart for details).     Patient was given a albuterol and Atrovent nebulizer treatment.  I ordered a continuous nebulizer with 10 mg of albuterol and Atrovent however the respiratory therapist changed it to a DuoNeb with 5 mg of albuterol and Atrovent.  Recheck at 5 AM patient states she is feeling better.  She has improved air movement however she still has some rhonchi and rare wheezing.  She feels like she can do a second nebulizer now.  Recheck at 6:50 AM patient states she feels much better.  She now has good air movement and she no  longer has any wheezing or rhonchi.  She feels ready to be discharged home.  We discussed that she has had cough for 3 weeks she would be started on antibiotic for bronchitis.  She states she has a spacer at home, she was given an albuterol inhaler to use. Pt doesn't have insurance she was prescribed Amoxil so hopefully she will get it filled.  She was not prescribed prednisone because she states she normally does not take it for her flare ups.   Final Clinical Impressions(s) / ED Diagnoses   Final diagnoses:  Cough  Bronchitis with bronchospasm    ED Discharge Orders        Ordered    amoxicillin (AMOXIL) 500 MG capsule  3 times daily     09/25/17 0704      Plan discharge  Devoria AlbeIva Mykela Mewborn, MD, Concha PyoFACEP     Eddrick Dilone, MD 09/25/17 [phone]

## 2017-09-25 NOTE — Discharge Instructions (Signed)
Drink plenty of fluids.  Take Mucinex DM over-the-counter for cough.  Take the antibiotic until gone.  Use the inhaler with the spacer for wheezing.  Return to the ED if you get a high fever, or you struggle to breathe despite using your inhaler.

## 2018-05-06 ENCOUNTER — Emergency Department (HOSPITAL_COMMUNITY)
Admission: EM | Admit: 2018-05-06 | Discharge: 2018-05-06 | Disposition: A | Discharge status: Home / Self Care | Payer: Medicaid Other | Attending: Emergency Medicine | Admitting: Emergency Medicine

## 2018-05-06 ENCOUNTER — Emergency Department (HOSPITAL_COMMUNITY): Payer: Medicaid Other

## 2018-05-06 ENCOUNTER — Other Ambulatory Visit: Payer: Self-pay

## 2018-05-06 ENCOUNTER — Encounter (HOSPITAL_COMMUNITY): Payer: Self-pay | Admitting: Emergency Medicine

## 2018-05-06 DIAGNOSIS — Y999 Unspecified external cause status: Secondary | ICD-10-CM | POA: Diagnosis not present

## 2018-05-06 DIAGNOSIS — Y929 Unspecified place or not applicable: Secondary | ICD-10-CM | POA: Insufficient documentation

## 2018-05-06 DIAGNOSIS — M25561 Pain in right knee: Secondary | ICD-10-CM | POA: Diagnosis not present

## 2018-05-06 DIAGNOSIS — S0990XA Unspecified injury of head, initial encounter: Secondary | ICD-10-CM | POA: Insufficient documentation

## 2018-05-06 DIAGNOSIS — Y939 Activity, unspecified: Secondary | ICD-10-CM | POA: Insufficient documentation

## 2018-05-06 DIAGNOSIS — W19XXXA Unspecified fall, initial encounter: Secondary | ICD-10-CM

## 2018-05-06 DIAGNOSIS — W109XXA Fall (on) (from) unspecified stairs and steps, initial encounter: Secondary | ICD-10-CM | POA: Insufficient documentation

## 2018-05-06 MED ORDER — ACETAMINOPHEN 325 MG PO TABS
650.0000 mg | ORAL_TABLET | Freq: Once | ORAL | Status: AC
  Administered 2018-05-06: 650 mg via ORAL
  Filled 2018-05-06: qty 2

## 2018-05-06 MED ORDER — NAPROXEN 250 MG PO TABS: 250.0000 mg | ORAL_TABLET | Freq: Two times a day (BID) | ORAL | 0 refills | Status: DC | PRN

## 2018-05-06 NOTE — Discharge Instructions (Signed)
Take the prescription as directed. Also take over the counter tylenol, as directed on packaging, as needed for discomfort.  Apply moist heat or ice to the area(s) of discomfort, for 15 minutes at a time, several times per day for the next few days.  Do not fall asleep on a heating or ice pack.  Call your regular medical doctor and the Orthopedic doctor on Monday to schedule a follow up appointment this week.  Return to the Emergency Department immediately if worsening.

## 2018-05-06 NOTE — ED Provider Notes (Signed)
[facility] EMERGENCY DEPARTMENT Provider Note   CSN: [id number] Arrival date & time: 05/06/18  1830     History   Chief Complaint Chief Complaint  Patient presents with  . Fall    HPI Charlene Key is a 27 y.o. female.  HPI  Pt was seen at 1850. Per pt, c/o sudden onset and resolution of one episode of trip and fall that occurred approximately 1 hour PTA. Pt states her right knee has been "paining her" for the past 2 to 3 weeks. States she "might have twisted it or something" several weeks ago. While pt was walking down the stairs tonight, her "knee gave out" and she fell. States she hit the back of her head on a step. Pt states she now has a headache and is nauseated. Pt was able to stand and ambulate directly after and since the fall. Denies LOC, no AMS, no back pain, no CP/SOB, no abd pain, no focal motor weakness, no tingling/numbness in extremities.   Past Medical History:  Diagnosis Date  . Medical history non-contributory     Patient Active Problem List   Diagnosis Date Noted  . Post-operative state 01/12/2017  . Abnormal uterine bleeding, postpartum 01/11/2017  . History of herpes genitalis 10/27/2016  . Pregnancy 10/16/2016    Past Surgical History:  Procedure Laterality Date  . ABDOMINAL HYSTERECTOMY Bilateral 01/12/2017   Procedure: HYSTERECTOMY ABDOMINAL BILATERAL SALPINGECTOMY;  Surgeon: Schermerhorn, Ihor Austin, MD;  Location: [facility] ORS;  Service: Gynecology;  Laterality: Bilateral;  . CESAREAN SECTION       OB History    Gravida  7   Para  6   Term  5   Preterm  1   AB      Living  6     SAB      TAB      Ectopic      Multiple  1   Live Births  7            Home Medications    Prior to Admission medications   Medication Sig Start Date End Date Taking? Authorizing Provider  acetaminophen (TYLENOL) 325 MG tablet Take 650 mg by mouth every 6 (six) hours as needed. 12/24/16   [provider]  amoxicillin (AMOXIL) 500 MG capsule  Take 1 capsule (500 mg total) by mouth 3 (three) times daily. 09/25/17   Devoria Albe, MD  ibuprofen (ADVIL,MOTRIN) 600 MG tablet Take 1 tablet (600 mg total) by mouth every 6 (six) hours. 01/13/17   Ward, Elenora Fender, MD  Prenatal Vit-Fe Fumarate-FA (PRENATAL MULTIVITAMIN) TABS tablet Take 1 tablet by mouth daily.    [provider]    Family History No family history on file.  Social History Social History   Tobacco Use  . Smoking status: Never Smoker  . Smokeless tobacco: Never Used  Substance Use Topics  . Alcohol use: No  . Drug use: No     Allergies   Patient has no known allergies.   Review of Systems Review of Systems ROS: Statement: All systems negative except as marked or noted in the HPI; Constitutional: Negative for fever and chills. ; ; Eyes: Negative for eye pain, redness and discharge. ; ; ENMT: Negative for ear pain, hoarseness, nasal congestion, sinus pressure and sore throat. ; ; Cardiovascular: Negative for chest pain, palpitations, diaphoresis, dyspnea and peripheral edema. ; ; Respiratory: Negative for cough, wheezing and stridor. ; ; Gastrointestinal: Negative for nausea, vomiting, diarrhea, abdominal pain, blood in  stool, hematemesis, jaundice and rectal bleeding. . ; ; Genitourinary: Negative for dysuria, flank pain and hematuria. ; ; Musculoskeletal: +head injury, knee pain. Negative for back pain. Negative for swelling and deformity.; ; Skin: Negative for pruritus, rash, abrasions, blisters, bruising and skin lesion.; ; Neuro: Negative for headache, lightheadedness and neck stiffness. Negative for weakness, altered level of consciousness, altered mental status, extremity weakness, paresthesias, involuntary movement, seizure and syncope.       Physical Exam Updated Vital Signs BP 128/84 (BP Location: Left Arm)   Pulse 90   Temp 98.7 F (37.1 C) (Oral)   Resp 17   Ht 5\' 3"  (1.6 m)   Wt 90.7 kg   LMP 06/10/2015   SpO2 98%   BMI 35.43 kg/m    Physical Exam 1855: Physical examination: Vital signs and O2 SAT: Reviewed; Constitutional: Well developed, Well nourished, Well hydrated, In no acute distress; Head and Face: Normocephalic, Atraumatic; Eyes: EOMI, PERRL, No scleral icterus; ENMT: Mouth and pharynx normal, Mucous membranes moist; Neck: Supple, Trachea midline. No abrasions or ecchymosis.; Spine:  No midline CS, TS, LS tenderness.; Cardiovascular: Regular rate and rhythm, No gallop; Respiratory: Breath sounds clear & equal bilaterally, No wheezes, Normal respiratory effort/excursion; Chest: Nontender, No deformity, Movement normal, No crepitus, No abrasions or ecchymosis.; Abdomen: Soft, Nontender, Nondistended, Normal bowel sounds, No abrasions or ecchymosis.; Genitourinary: No CVA tenderness;; Extremities: +FROM right knee, including able to lift extended RLE off stretcher, and extend right lower leg against resistance.  No ligamentous laxity.  No patellar or quad tendon step-offs.  NMS intact right foot, strong pedal pp. +plantarflexion of right foot w/calf squeeze.  No palpable gap right Achilles's tendon.  No proximal fibular head tenderness.  No edema, erythema, warmth, ecchymosis or deformity.  No specific area of point tenderness.  Full range of motion major/large joints of bilat UE's and LE's without pain or tenderness to palp, Neurovascularly intact, Pulses normal, No deformity. No tenderness, No edema, Pelvis stable; Neuro: AA&Ox3, GCS 15. No facial droop. Major CN grossly intact. Speech clear. No gross focal motor or sensory deficits in extremities.; Skin: Color normal, Warm, Dry    ED Treatments / Results  Labs (all labs ordered are listed, but only abnormal results are displayed)   EKG None  Radiology   Procedures Procedures (including critical care time)  Medications Ordered in ED Medications  acetaminophen (TYLENOL) tablet 650 mg (has no administration in time range)     Initial Impression / Assessment  and Plan / ED Course  I have reviewed the triage vital signs and the nursing notes.  Pertinent labs & imaging results that were available during my care of the patient were reviewed by me and considered in my medical decision making (see chart for details).  MDM Reviewed: previous chart, nursing note and vitals Interpretation: x-ray and CT scan   Ct Head Wo Contrast Result Date: 05/06/2018 CLINICAL DATA:  27 year old female status post fall down stairs striking the back of her head. Headache, nausea vomiting. EXAM: CT HEAD WITHOUT CONTRAST CT CERVICAL SPINE WITHOUT CONTRAST TECHNIQUE: Multidetector CT imaging of the head and cervical spine was performed following the standard protocol without intravenous contrast. Multiplanar CT image reconstructions of the cervical spine were also generated. COMPARISON:  Head CT 01/11/2017. FINDINGS: CT HEAD FINDINGS Brain: Normal cerebral volume. Partially empty sella re-demonstrated. No midline shift, ventriculomegaly, mass effect, evidence of mass lesion, intracranial hemorrhage or evidence of cortically based acute infarction. Gray-white matter differentiation is within normal limits throughout the  brain. Vascular: No suspicious intracranial vascular hyperdensity. Skull: Stable and intact. Sinuses/Orbits: Mild to moderate ethmoid mucosal thickening is new since 2018. Mild bubbly opacity and mucosal thickening also at both maxillary ostia. Otherwise stable and well pneumatized, including the tympanic cavities and mastoids. Other: Visualized orbit soft tissues are within normal limits. No scalp hematoma or injury identified. CT CERVICAL SPINE FINDINGS Alignment: Straightening and mild reversal of cervical lordosis. Cervicothoracic junction alignment is within normal limits. Bilateral posterior element alignment is within normal limits. Skull base and vertebrae: Visualized skull base is intact. No atlanto-occipital dissociation. No cervical spine fracture. Soft tissues  and spinal canal: No prevertebral fluid or swelling. No visible canal hematoma. Negative noncontrast neck soft tissues. Disc levels:  No degenerative changes. Upper chest: Visible upper thoracic levels appear intact. Negative visible lung apices and noncontrast thoracic inlet. IMPRESSION: 1. No acute traumatic injury identified in the head or cervical spine. 2. Stable and normal noncontrast CT appearance of the brain aside from partially empty sella, which can be a normal variant but can be associated with idiopathic intracranial hypertension (pseudotumor cerebri). 3. Increased paranasal sinus inflammation in the ethmoids and maxillary antra. Electronically Signed   By: Odessa Fleming M.D.   On: 05/06/2018 19:29   Ct Cervical Spine Wo Contrast Result Date: 05/06/2018 CLINICAL DATA:  27 year old female status post fall down stairs striking the back of her head. Headache, nausea vomiting. EXAM: CT HEAD WITHOUT CONTRAST CT CERVICAL SPINE WITHOUT CONTRAST TECHNIQUE: Multidetector CT imaging of the head and cervical spine was performed following the standard protocol without intravenous contrast. Multiplanar CT image reconstructions of the cervical spine were also generated. COMPARISON:  Head CT 01/11/2017. FINDINGS: CT HEAD FINDINGS Brain: Normal cerebral volume. Partially empty sella re-demonstrated. No midline shift, ventriculomegaly, mass effect, evidence of mass lesion, intracranial hemorrhage or evidence of cortically based acute infarction. Gray-white matter differentiation is within normal limits throughout the brain. Vascular: No suspicious intracranial vascular hyperdensity. Skull: Stable and intact. Sinuses/Orbits: Mild to moderate ethmoid mucosal thickening is new since 2018. Mild bubbly opacity and mucosal thickening also at both maxillary ostia. Otherwise stable and well pneumatized, including the tympanic cavities and mastoids. Other: Visualized orbit soft tissues are within normal limits. No scalp hematoma  or injury identified. CT CERVICAL SPINE FINDINGS Alignment: Straightening and mild reversal of cervical lordosis. Cervicothoracic junction alignment is within normal limits. Bilateral posterior element alignment is within normal limits. Skull base and vertebrae: Visualized skull base is intact. No atlanto-occipital dissociation. No cervical spine fracture. Soft tissues and spinal canal: No prevertebral fluid or swelling. No visible canal hematoma. Negative noncontrast neck soft tissues. Disc levels:  No degenerative changes. Upper chest: Visible upper thoracic levels appear intact. Negative visible lung apices and noncontrast thoracic inlet. IMPRESSION: 1. No acute traumatic injury identified in the head or cervical spine. 2. Stable and normal noncontrast CT appearance of the brain aside from partially empty sella, which can be a normal variant but can be associated with idiopathic intracranial hypertension (pseudotumor cerebri). 3. Increased paranasal sinus inflammation in the ethmoids and maxillary antra. Electronically Signed   By: Odessa Fleming M.D.   On: 05/06/2018 19:29   Dg Knee Complete 4 Views Right Result Date: 05/06/2018 CLINICAL DATA:  Pain for 2-3 weeks EXAM: RIGHT KNEE - COMPLETE 4+ VIEW COMPARISON:  None. FINDINGS: No evidence of fracture, dislocation, or joint effusion. No evidence of arthropathy or other focal bone abnormality. Soft tissues are unremarkable. IMPRESSION: Negative. Electronically Signed   By: Jasmine Pang  M.D.   On: 05/06/2018 19:16    1940:  XR/CT reassuring. Pt has tol PO well while in the ED without N/V. Tx symptomatically, f/u PMD and Ortho MD. Dx and testing d/w pt.  Questions answered.  Verb understanding, agreeable to d/c home with outpt f/u.   Final Clinical Impressions(s) / ED Diagnoses   Final diagnoses:  None    ED Discharge Orders    None       Samuel Jester, DO 05/09/18 1610

## 2018-05-06 NOTE — ED Triage Notes (Signed)
Pt states her knee gave out, falling down the stairs and hitting the back of her head.  Pt now states having a headache and n/v.

## 2019-04-13 ENCOUNTER — Other Ambulatory Visit: Payer: Self-pay

## 2019-04-13 ENCOUNTER — Encounter (HOSPITAL_COMMUNITY): Payer: Self-pay | Admitting: Emergency Medicine

## 2019-04-13 ENCOUNTER — Emergency Department (HOSPITAL_COMMUNITY)
Admission: EM | Admit: 2019-04-13 | Discharge: 2019-04-13 | Disposition: A | Discharge status: Home / Self Care | Payer: Medicaid Other | Attending: Emergency Medicine | Admitting: Emergency Medicine

## 2019-04-13 DIAGNOSIS — Y92128 Other place in nursing home as the place of occurrence of the external cause: Secondary | ICD-10-CM | POA: Insufficient documentation

## 2019-04-13 DIAGNOSIS — Y990 Civilian activity done for income or pay: Secondary | ICD-10-CM | POA: Diagnosis not present

## 2019-04-13 DIAGNOSIS — Y93F9 Activity, other caregiving: Secondary | ICD-10-CM | POA: Diagnosis not present

## 2019-04-13 DIAGNOSIS — W57XXXA Bitten or stung by nonvenomous insect and other nonvenomous arthropods, initial encounter: Secondary | ICD-10-CM | POA: Diagnosis not present

## 2019-04-13 DIAGNOSIS — Z79899 Other long term (current) drug therapy: Secondary | ICD-10-CM | POA: Diagnosis not present

## 2019-04-13 DIAGNOSIS — S50862A Insect bite (nonvenomous) of left forearm, initial encounter: Secondary | ICD-10-CM | POA: Diagnosis present

## 2019-04-13 MED ORDER — FAMOTIDINE 20 MG PO TABS
20.0000 mg | ORAL_TABLET | Freq: Once | ORAL | Status: AC
  Administered 2019-04-13: 20 mg via ORAL
  Filled 2019-04-13: qty 1

## 2019-04-13 MED ORDER — FAMOTIDINE 20 MG PO TABS: 20.0000 mg | ORAL_TABLET | Freq: Two times a day (BID) | ORAL | 0 refills | Status: DC

## 2019-04-13 NOTE — ED Provider Notes (Signed)
[facility] EMERGENCY DEPARTMENT Provider Note   CSN: [id number] Arrival date & time: 04/13/19  0142   Time seen 3:35 AM  History   Chief Complaint Chief Complaint  Patient presents with  . Insect Bite    HPI Charlene Key is a 28 y.o. female.     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  She states she started having some itching and had a small spot in the area and some mild swelling.  She states it felt warm to touch.  She states it got bigger the following day however she states today the swelling seems better and it is not as big as it was.  However she states it still itching.  She denies any fever or chills.  She states she has had reaction to insect stings in the past.  She has never had a boil or abscess.  PCP [place]   Past Medical History:  Diagnosis Date  . Medical history non-contributory     Patient Active Problem List   Diagnosis Date Noted  . Post-operative state 01/12/2017  . Abnormal uterine bleeding, postpartum 01/11/2017  . History of herpes genitalis 10/27/2016  . Pregnancy 10/16/2016    Past Surgical History:  Procedure Laterality Date  . ABDOMINAL HYSTERECTOMY Bilateral 01/12/2017   Procedure: HYSTERECTOMY ABDOMINAL BILATERAL SALPINGECTOMY;  Surgeon: Schermerhorn, Gwen Her, MD;  Location: [facility] ORS;  Service: Gynecology;  Laterality: Bilateral;  . CESAREAN SECTION       OB History    Gravida  7   Para  6   Term  5   Preterm  1   AB      Living  6     SAB      TAB      Ectopic      Multiple  1   Live Births  7            Home Medications    Prior to Admission medications   Medication Sig Start Date End Date Taking? Authorizing Provider  acetaminophen (TYLENOL) 325 MG tablet Take 650 mg by mouth every 6 (six) hours as needed. 12/24/16   [provider]   amoxicillin (AMOXIL) 500 MG capsule Take 1 capsule (500 mg total) by mouth 3 (three) times daily. 09/25/17   Rolland Porter, MD  famotidine (PEPCID) 20 MG tablet Take 1 tablet (20 mg total) by mouth 2 (two) times daily. 04/13/19   Rolland Porter, MD  ibuprofen (ADVIL,MOTRIN) 600 MG tablet Take 1 tablet (600 mg total) by mouth every 6 (six) hours. 01/13/17   Ward, Honor Loh, MD  naproxen (NAPROSYN) 250 MG tablet Take 1 tablet (250 mg total) by mouth 2 (two) times daily as needed for mild pain or moderate pain (take with food). 05/06/18   Francine Graven, DO  Prenatal Vit-Fe Fumarate-FA (PRENATAL MULTIVITAMIN) TABS tablet Take 1 tablet by mouth daily.    [provider]    Family History No family history on file.  Social History Social History   Tobacco Use  . Smoking status: Never Smoker  . Smokeless tobacco: Never Used  Substance Use Topics  . Alcohol use: No  . Drug use: No  employed   Allergies   Patient has no known allergies.   Review of Systems Review of Systems  All  other systems reviewed and are negative.    Physical Exam Updated Vital Signs BP 119/79   Pulse 88   Temp 97.9 F (36.6 C)   Resp 18   Ht 5\' 3"  (1.6 m)   Wt 90.7 kg   LMP 06/10/2015   SpO2 100%   BMI 35.43 kg/m   Physical Exam Vitals signs and nursing note reviewed.  Constitutional:      Appearance: Normal appearance.  HENT:     Head: Normocephalic and atraumatic.  Eyes:     Extraocular Movements: Extraocular movements intact.     Conjunctiva/sclera: Conjunctivae normal.  Neck:     Musculoskeletal: Normal range of motion.  Cardiovascular:     Rate and Rhythm: Normal rate.  Pulmonary:     Effort: Pulmonary effort is normal. No respiratory distress.  Musculoskeletal: Normal range of motion.        General: Tenderness present. No deformity.  Skin:    General: Skin is warm and dry.     Findings: Erythema present.     Comments: Patient has an area of redness approximately 4 cm in  diameter that is reddened and slightly warm to touch.  When I palpate it there is no localized area of induration tooth suggest a abscess.  Patient states it smaller than it was yesterday.  This appears to be a localized reaction to insect bite.  There is nothing to suggest it is a brown recluse spider bite.  Neurological:     General: No focal deficit present.     Mental Status: She is alert and oriented to person, place, and time.     Cranial Nerves: No cranial nerve deficit.  Psychiatric:        Mood and Affect: Mood normal.        Behavior: Behavior normal.        Thought Content: Thought content normal.        ED Treatments / Results  Labs (all labs ordered are listed, but only abnormal results are displayed) Labs Reviewed - No data to display  EKG None  Radiology No results found.  Procedures Procedures (including critical care time)  Medications Ordered in ED Medications  famotidine (PEPCID) tablet 20 mg (has no administration in time range)     Initial Impression / Assessment and Plan / ED Course  I have reviewed the triage vital signs and the nursing notes.  Pertinent labs & imaging results that were available during my care of the patient were reviewed by me and considered in my medical decision making (see chart for details).        Patient has a localized reaction to an insect bite.  There is nothing to suggest she has an abscess forming.  She actually states it is getting smaller.  I do not suspect cellulitis.  She was advised on what to take at home.  Final Clinical Impressions(s) / ED Diagnoses   Final diagnoses:  Insect bite of left forearm, initial encounter    ED Discharge Orders         Ordered    famotidine (PEPCID) 20 MG tablet  2 times daily     04/13/19 0411          Plan discharge  06/13/19, MD, Devoria Albe, MD 04/13/19 [phone]

## 2019-04-13 NOTE — ED Triage Notes (Signed)
Pt c/o insect bite to left forearm that started Sunday. Pt doesn't know what bit her. Pt's arm has some redness and warmth.

## 2019-04-13 NOTE — Discharge Instructions (Addendum)
You can use Benadryl cream and hydrocortisone cream over the insect bite area for the itching and swelling.  Take the Pepcid twice a day until the itching or the swelling is gone.  An ice pack over the area will also help with the itching.  Recheck if you see a red streak going up your arm, you get fever chills, or the redness starts spreading or you start getting a lot of pain in the area.

## 2019-08-04 ENCOUNTER — Other Ambulatory Visit: Payer: Self-pay

## 2019-08-04 ENCOUNTER — Emergency Department (HOSPITAL_COMMUNITY): Payer: Medicaid Other

## 2019-08-04 ENCOUNTER — Encounter (HOSPITAL_COMMUNITY): Payer: Self-pay

## 2019-08-04 ENCOUNTER — Emergency Department (HOSPITAL_COMMUNITY)
Admission: EM | Admit: 2019-08-04 | Discharge: 2019-08-04 | Disposition: A | Discharge status: Home / Self Care | Payer: Medicaid Other | Attending: Emergency Medicine | Admitting: Emergency Medicine

## 2019-08-04 DIAGNOSIS — Y92009 Unspecified place in unspecified non-institutional (private) residence as the place of occurrence of the external cause: Secondary | ICD-10-CM | POA: Insufficient documentation

## 2019-08-04 DIAGNOSIS — Y9389 Activity, other specified: Secondary | ICD-10-CM | POA: Insufficient documentation

## 2019-08-04 DIAGNOSIS — S46912A Strain of unspecified muscle, fascia and tendon at shoulder and upper arm level, left arm, initial encounter: Secondary | ICD-10-CM | POA: Insufficient documentation

## 2019-08-04 DIAGNOSIS — Y999 Unspecified external cause status: Secondary | ICD-10-CM | POA: Insufficient documentation

## 2019-08-04 DIAGNOSIS — Z79899 Other long term (current) drug therapy: Secondary | ICD-10-CM | POA: Insufficient documentation

## 2019-08-04 DIAGNOSIS — S4992XA Unspecified injury of left shoulder and upper arm, initial encounter: Secondary | ICD-10-CM | POA: Diagnosis present

## 2019-08-04 MED ORDER — PREDNISONE 10 MG PO TABS: ORAL_TABLET | ORAL | 0 refills | Status: DC

## 2019-08-04 MED ORDER — PREDNISONE 50 MG PO TABS
60.0000 mg | ORAL_TABLET | Freq: Once | ORAL | Status: AC
  Administered 2019-08-04: 60 mg via ORAL
  Filled 2019-08-04: qty 1

## 2019-08-04 NOTE — Discharge Instructions (Addendum)
Your x-ray today is negative for any acute bony injury or dislocation.  I suspect that this is a soft tissue injury such as tendon or muscle strain.  You will need further evaluation by an orthopedic specialist if your symptoms persist.  I have prescribed some medication which may help relieve and heal your symptoms.  I recommend continued use of heat 20 minutes several times daily.  Avoid activity that worsens your pain such as overhead reaching.  Call Dr. Jena Gauss or an orthopedist of your choice for further management of this injury.

## 2019-08-04 NOTE — ED Triage Notes (Signed)
Pt says she fell off of a hover board around Christmas.  C/O left shoulder pain since the fall.

## 2019-08-04 NOTE — ED Provider Notes (Signed)
[facility] EMERGENCY DEPARTMENT Provider Note   CSN: [id number] Arrival date & time: 08/04/19  7353     History Chief Complaint  Patient presents with  . Shoulder Pain    Charlene Key is a 29 y.o. female.  The history is provided by the patient.  Shoulder Pain Location:  Shoulder Shoulder location:  L shoulder Injury: yes   Time since incident:  3 weeks Mechanism of injury: fall   Mechanism of injury comment:  Direct blow to posterior left shoulder.  she fell off a hoverboard and hit her shoulder into a sofa when she fell. Fall:    Impact surface:  Furniture   Entrapped after fall: no   Pain details:    Quality:  Aching   Radiates to:  Does not radiate   Severity:  Moderate   Onset quality:  Sudden   Progression:  Worsening Handedness:  Right-handed Prior injury to area:  No Relieved by:  Rest Worsened by:  Movement (movement above shoulder height level increases pain) Ineffective treatments:  Heat Associated symptoms: decreased range of motion   Associated symptoms: no fever, no muscle weakness, no neck pain, no numbness, no swelling and no tingling    Pt describes a weak sensation, sometimes feels her shoulder joint is loose, pulling sensation with gravity at times.     Past Medical History:  Diagnosis Date  . Medical history non-contributory     Patient Active Problem List   Diagnosis Date Noted  . Post-operative state 01/12/2017  . Abnormal uterine bleeding, postpartum 01/11/2017  . History of herpes genitalis 10/27/2016  . Pregnancy 10/16/2016    Past Surgical History:  Procedure Laterality Date  . ABDOMINAL HYSTERECTOMY Bilateral 01/12/2017   Procedure: HYSTERECTOMY ABDOMINAL BILATERAL SALPINGECTOMY;  Surgeon: Schermerhorn, Gwen Her, MD;  Location: [facility] ORS;  Service: Gynecology;  Laterality: Bilateral;  . CESAREAN SECTION       OB History    Gravida  7   Para  6   Term  5   Preterm  1   AB      Living  6     SAB      TAB      Ectopic      Multiple  1   Live Births  7           No family history on file.  Social History   Tobacco Use  . Smoking status: Never Smoker  . Smokeless tobacco: Never Used  Substance Use Topics  . Alcohol use: No  . Drug use: No    Home Medications Prior to Admission medications   Medication Sig Start Date End Date Taking? Authorizing Provider  acetaminophen (TYLENOL) 325 MG tablet Take 650 mg by mouth every 6 (six) hours as needed. 12/24/16   [provider]  amoxicillin (AMOXIL) 500 MG capsule Take 1 capsule (500 mg total) by mouth 3 (three) times daily. 09/25/17   Rolland Porter, MD  famotidine (PEPCID) 20 MG tablet Take 1 tablet (20 mg total) by mouth 2 (two) times daily. 04/13/19   Rolland Porter, MD  ibuprofen (ADVIL,MOTRIN) 600 MG tablet Take 1 tablet (600 mg total) by mouth every 6 (six) hours. 01/13/17   Ward, Honor Loh, MD  naproxen (NAPROSYN) 250 MG tablet Take 1 tablet (250 mg total) by mouth 2 (two) times daily as needed for mild pain or moderate pain (take with food). 05/06/18   Francine Graven, DO  predniSONE (DELTASONE) 10 MG tablet Take 6 tablets day  one, 5 tablets day two, 4 tablets day three, 3 tablets day four, 2 tablets day five, then 1 tablet day six 08/04/19   Burgess Amor, PA-C  Prenatal Vit-Fe Fumarate-FA (PRENATAL MULTIVITAMIN) TABS tablet Take 1 tablet by mouth daily.    [provider]    Allergies    Patient has no known allergies.  Review of Systems   Review of Systems  Constitutional: Negative for fever.  HENT: Negative.   Respiratory: Negative.   Cardiovascular: Negative.   Gastrointestinal: Negative.   Musculoskeletal: Positive for arthralgias. Negative for joint swelling, myalgias and neck pain.  Neurological: Negative for weakness and numbness.    Physical Exam Updated Vital Signs BP 132/89 (BP Location: Left Arm)   Pulse 79   Temp 98.6 F (37 C) (Oral)   Resp 16   Ht 5\' 3"  (1.6 m)   Wt 90.7 kg   LMP 06/10/2015    SpO2 98%   BMI 35.43 kg/m   Physical Exam Constitutional:      Appearance: She is well-developed.  HENT:     Head: Atraumatic.  Cardiovascular:     Comments: Pulses equal bilaterally Musculoskeletal:        General: Tenderness present. No deformity.     Left shoulder: Bony tenderness present. No swelling, deformity, effusion or crepitus. Decreased strength. Normal pulse.     Cervical back: Normal range of motion. No swelling, deformity or tenderness. Normal range of motion.     Comments: Equal grip strength.  ttp across anterior left shoulder joint. No crepitus with ROM.  Pain worsened with forward flexion and abduction beyond 90 degrees. Passive ROM better but no pain free.  Skin:    General: Skin is warm and dry.  Neurological:     Mental Status: She is alert.     Sensory: No sensory deficit.     Deep Tendon Reflexes: Reflexes normal.     ED Results / Procedures / Treatments   Labs (all labs ordered are listed, but only abnormal results are displayed) Labs Reviewed - No data to display  EKG None  Radiology DG Shoulder Left  Result Date: 08/04/2019 CLINICAL DATA:  Left shoulder pain, fall in December EXAM: LEFT SHOULDER - 2+ VIEW COMPARISON:  None. FINDINGS: No fracture or dislocation is seen. The joint spaces are preserved. Visualized soft tissues are within normal limits. Visualized left lung is clear. IMPRESSION: Negative. Electronically Signed   By: January M.D.   On: 08/04/2019 09:13    Procedures Procedures (including critical care time)  Medications Ordered in ED Medications  predniSONE (DELTASONE) tablet 60 mg (60 mg Oral Given 08/04/19 0944)    ED Course  I have reviewed the triage vital signs and the nursing notes.  Pertinent labs & imaging results that were available during my care of the patient were reviewed by me and considered in my medical decision making (see chart for details).    MDM Rules/Calculators/A&P                      X-rays  reviewed, interpreted and discussed with patient.  She has no bony injury or dislocation.  I suspect she may have a shoulder strain, however discussed that we cannot rule out a rotator cuff injury or tear with today's exam or imaging.  She was referred to orthopedics for follow-up care if her symptoms do not improve.  We discussed home treatment.  She was placed on a prednisone taper.  Activity as  tolerated. Final Clinical Impression(s) / ED Diagnoses Final diagnoses:  Strain of left shoulder, initial encounter    Rx / DC Orders ED Discharge Orders         Ordered    predniSONE (DELTASONE) 10 MG tablet     08/04/19 0936           Burgess Amor, PA-C 08/04/19 2119    Long, Arlyss Repress, MD 08/04/19 [phone]

## 2019-08-08 ENCOUNTER — Ambulatory Visit: Payer: Medicaid Other | Attending: Internal Medicine

## 2019-08-08 ENCOUNTER — Other Ambulatory Visit: Payer: Self-pay

## 2019-08-08 DIAGNOSIS — Z20822 Contact with and (suspected) exposure to covid-19: Secondary | ICD-10-CM

## 2019-08-09 LAB — NOVEL CORONAVIRUS, NAA: SARS-CoV-2, NAA: NOT DETECTED

## 2020-10-11 ENCOUNTER — Other Ambulatory Visit: Payer: Self-pay

## 2020-10-11 ENCOUNTER — Emergency Department
Admission: EM | Admit: 2020-10-11 | Discharge: 2020-10-11 | Disposition: A | Discharge status: Home / Self Care | Payer: Medicaid Other | Attending: Student in an Organized Health Care Education/Training Program | Admitting: Student in an Organized Health Care Education/Training Program

## 2020-10-11 DIAGNOSIS — K029 Dental caries, unspecified: Secondary | ICD-10-CM | POA: Insufficient documentation

## 2020-10-11 DIAGNOSIS — K0889 Other specified disorders of teeth and supporting structures: Secondary | ICD-10-CM | POA: Diagnosis present

## 2020-10-11 MED ORDER — AMOXICILLIN 500 MG PO CAPS: 500.0000 mg | ORAL_CAPSULE | Freq: Three times a day (TID) | ORAL | 0 refills | Status: DC

## 2020-10-11 MED ORDER — AMOXICILLIN 500 MG PO CAPS
500.0000 mg | ORAL_CAPSULE | Freq: Once | ORAL | Status: AC
  Administered 2020-10-11: 500 mg via ORAL
  Filled 2020-10-11: qty 1

## 2020-10-11 NOTE — ED Provider Notes (Signed)
[facility] Emergency Department Provider Note ____________________________________________  Time seen: 2212  I have reviewed the triage vital signs and the nursing notes.  HISTORY  Chief Complaint  Dental Pain  HPI Charlene Key is a 30 y.o. female presents to the ED for evaluation of pain and sensitivity to the upper left primary and secondary incisors.  Reports onset about 2 to 3 hours prior to arrival.  She reports pain is increased with biting and chewing, and feels like there is a "bubble" at the root of her teeth but she denies any spontaneous purulent drainage, gum swelling, difficulty swallowing, or controlling oral secretions patient also denies any fever, chills, sweats, chest pain, or shortness of breath.  Past Medical History:  Diagnosis Date  . Medical history non-contributory     Patient Active Problem List   Diagnosis Date Noted  . Post-operative state 01/12/2017  . Abnormal uterine bleeding, postpartum 01/11/2017  . History of herpes genitalis 10/27/2016  . Pregnancy 10/16/2016    Past Surgical History:  Procedure Laterality Date  . ABDOMINAL HYSTERECTOMY Bilateral 01/12/2017   Procedure: HYSTERECTOMY ABDOMINAL BILATERAL SALPINGECTOMY;  Surgeon: Schermerhorn, Ihor Austin, MD;  Location: [facility] ORS;  Service: Gynecology;  Laterality: Bilateral;  . CESAREAN SECTION      Prior to Admission medications   Medication Sig Start Date End Date Taking? Authorizing Provider  amoxicillin (AMOXIL) 500 MG capsule Take 1 capsule (500 mg total) by mouth 3 (three) times daily. 10/11/20  Yes Anisten Tomassi, Charlesetta Ivory, PA-C  acetaminophen (TYLENOL) 325 MG tablet Take 650 mg by mouth every 6 (six) hours as needed. 12/24/16   [provider]  famotidine (PEPCID) 20 MG tablet Take 1 tablet (20 mg total) by mouth 2 (two) times daily. 04/13/19   Devoria Albe, MD  Prenatal Vit-Fe Fumarate-FA (PRENATAL MULTIVITAMIN) TABS tablet Take 1 tablet by mouth daily.    [provider]    Allergies Patient has no known allergies.  History reviewed. No pertinent family history.  Social History Social History   Tobacco Use  . Smoking status: Never Smoker  . Smokeless tobacco: Never Used  Substance Use Topics  . Alcohol use: No  . Drug use: No    Review of Systems  Constitutional: Negative for fever. Eyes: Negative for visual changes. ENT: Negative for sore throat.  Dental pain as above. Cardiovascular: Negative for chest pain. Respiratory: Negative for shortness of breath. Gastrointestinal: Negative for abdominal pain, vomiting and diarrhea. Genitourinary: Negative for dysuria. Musculoskeletal: Negative for back pain. Skin: Negative for rash. Neurological: Negative for headaches, focal weakness or numbness. ____________________________________________  PHYSICAL EXAM:  VITAL SIGNS: ED Triage Vitals  Enc Vitals Group     BP 10/11/20 2125 125/90     Pulse Rate 10/11/20 2125 94     Resp 10/11/20 2125 16     Temp 10/11/20 2125 98.6 F (37 C)     Temp Source 10/11/20 2125 Oral     SpO2 10/11/20 2125 100 %     Weight 10/11/20 2126 229 lb 15 oz (104.3 kg)     Height 10/11/20 2126 5\' 3"  (1.6 m)     Head Circumference --      Peak Flow --      Pain Score 10/11/20 2126 6     Pain Loc --      Pain Edu? --      Excl. in GC? --     Constitutional: Alert and oriented. Well appearing and in no distress. Head:  Normocephalic and atraumatic. Eyes: Conjunctivae are normal. Normal extraocular movements Nose: No congestion/rhinorrhea/epistaxis. Mouth/Throat: Mucous membranes are moist.  Uvula is midline and tonsils are flat.  Oropharyngeal lesions are appreciated.  Patient tenderness to palpate over the left primary and secondary incisors of the upper maxilla.  No focal gum swelling is appreciated.  Primary incisor is slightly hyperpigmented, concerning for dental caries.  Brawny sublingual edema is appreciated.  Neck: Supple. No  thyromegaly. Hematological/Lymphatic/Immunological: No cervical lymphadenopathy. Cardiovascular: Normal rate, regular rhythm. Normal distal pulses. Respiratory: Normal respiratory effort. No wheezes/rales/rhonchi. Musculoskeletal: Nontender with normal range of motion in all extremities.  Neurologic:  Normal gait without ataxia. Normal speech and language. No gross focal neurologic deficits are appreciated. Skin:  Skin is warm, dry and intact. No rash noted. ____________________________________________  PROCEDURES  Amoxicillin 500 mg PO  Procedures ____________________________________________  INITIAL IMPRESSION / ASSESSMENT AND PLAN / ED COURSE  DDX: dental abscess, dental caries, dental pain  Patient ED evaluation of 2 to 3 hours of upper left incisor pain.  Patient without any signs of acute infectious process on exam.  No focal swelling or pointing is appreciated at the gumline.  She is tender to palpation over the primary secondary incisors.  She will be treated empirically with amoxicillin.  She will follow-up with her dental provider next week or return to the ED if needed.  Return precautions have been discussed.   Charlene Key was evaluated in Emergency Department on 10/11/2020 for the symptoms described in the history of present illness. She was evaluated in the context of the global COVID-19 pandemic, which necessitated consideration that the patient might be at risk for infection with the SARS-CoV-2 virus that causes COVID-19. Institutional protocols and algorithms that pertain to the evaluation of patients at risk for COVID-19 are in a state of rapid change based on information released by regulatory bodies including the CDC and federal and state organizations. These policies and algorithms were followed during the patient's care in the ED. ____________________________________________  FINAL CLINICAL IMPRESSION(S) / ED DIAGNOSES  Final diagnoses:  Dental caries  Pain due to  dental caries      Jossalyn Forgione, Charlesetta Ivory, PA-C 10/11/20 2236    Willy Eddy, MD 10/11/20 2329

## 2020-10-11 NOTE — ED Triage Notes (Signed)
Pt presents to ER c/o dental pain in her upper left front tooth and left incisor x2-3 hrs.  Pt states pain radiates to her left ear.

## 2020-10-11 NOTE — Discharge Instructions (Addendum)
Take the antibiotic as directed. Follow-up with your dental provider for further care.

## 2021-04-19 ENCOUNTER — Ambulatory Visit
Admission: EM | Admit: 2021-04-19 | Discharge: 2021-04-19 | Disposition: A | Discharge status: Home / Self Care | Payer: Medicaid Other | Attending: Family Medicine | Admitting: Family Medicine

## 2021-04-19 ENCOUNTER — Encounter: Payer: Self-pay | Admitting: Emergency Medicine

## 2021-04-19 ENCOUNTER — Ambulatory Visit (INDEPENDENT_AMBULATORY_CARE_PROVIDER_SITE_OTHER): Payer: Medicaid Other

## 2021-04-19 DIAGNOSIS — R062 Wheezing: Secondary | ICD-10-CM

## 2021-04-19 DIAGNOSIS — R059 Cough, unspecified: Secondary | ICD-10-CM | POA: Diagnosis not present

## 2021-04-19 DIAGNOSIS — J189 Pneumonia, unspecified organism: Secondary | ICD-10-CM | POA: Diagnosis not present

## 2021-04-19 DIAGNOSIS — R0600 Dyspnea, unspecified: Secondary | ICD-10-CM | POA: Diagnosis not present

## 2021-04-19 MED ORDER — ALBUTEROL SULFATE HFA 108 (90 BASE) MCG/ACT IN AERS: 1.0000 | INHALATION_SPRAY | Freq: Four times a day (QID) | RESPIRATORY_TRACT | 1 refills | Status: DC | PRN

## 2021-04-19 MED ORDER — PREDNISONE 20 MG PO TABS: 40.0000 mg | ORAL_TABLET | Freq: Every day | ORAL | 0 refills | Status: DC

## 2021-04-19 MED ORDER — DOXYCYCLINE HYCLATE 100 MG PO CAPS: 100.0000 mg | ORAL_CAPSULE | Freq: Two times a day (BID) | ORAL | 0 refills | Status: DC

## 2021-04-19 NOTE — ED Triage Notes (Addendum)
Pt presents today with c/o of nasal congestion, cough and SOB x 1.5 weeks. Denies fever. Pt declines Covid testing today.

## 2021-04-19 NOTE — ED Provider Notes (Signed)
[facility] CARE CENTER   [id number] 04/19/21 Arrival Time: 9024  ASSESSMENT & PLAN:  1. Community acquired pneumonia of right middle lobe of lung   2. Wheezing    No resp distress. Work note provided. OTC symptom care as needed.  I have personally viewed the imaging studies ordered this visit. Very subtle RML haziness.  Begin: Meds ordered this encounter  Medications   doxycycline (VIBRAMYCIN) 100 MG capsule    Sig: Take 1 capsule (100 mg total) by mouth 2 (two) times daily.    Dispense:  20 capsule    Refill:  0   predniSONE (DELTASONE) 20 MG tablet    Sig: Take 2 tablets (40 mg total) by mouth daily.    Dispense:  10 tablet    Refill:  0   albuterol (VENTOLIN HFA) 108 (90 Base) MCG/ACT inhaler    Sig: Inhale 1-2 puffs into the lungs every 6 (six) hours as needed for wheezing or shortness of breath.    Dispense:  1 each    Refill:  1     Follow-up Information     [facility].   Specialty: Urgent Care Why: If worsening or failing to improve as anticipated. Contact information: [address] [place] [address] [phone]                Reviewed expectations re: course of current medical issues. Questions answered. Outlined signs and symptoms indicating need for more acute intervention. Understanding verbalized. After Visit Summary given.   SUBJECTIVE: History from: patient. Charlene Key is a 30 y.o. female who reports cough/congestion and occas SOB; x 1.5 weeks; feels she is wheezing; alb inhaler with some help "but old". Denies: fever. Normal PO intake without n/v/d. Is fatigued.   OBJECTIVE:  Vitals:   04/19/21 0853  BP: 122/88  Pulse: 97  Resp: 18  Temp: 98.8 F (37.1 C)  TempSrc: Oral  SpO2: 96%    General appearance: alert; no distress Eyes: PERRLA; EOMI; conjunctiva normal HENT: [place]; AT; with nasal congestion Neck: supple  Lungs: speaks full sentences without difficulty;  unlabored; significant insp/exp wheezing bilaterally Extremities: no edema Skin: warm and dry Neurologic: normal gait Psychological: alert and cooperative; normal mood and affect   Imaging: DG Chest 2 View  Result Date: 04/19/2021 CLINICAL DATA:  Cough and dyspnea EXAM: CHEST - 2 VIEW COMPARISON:  09/25/2017 chest radiograph. FINDINGS: Stable cardiomediastinal silhouette with normal heart size. No pneumothorax. No pleural effusion. Faint patchy right middle lobe opacity, new. Otherwise clear lungs. IMPRESSION: Faint patchy right middle lobe opacity, new, suggesting mild pneumonia. Chest radiograph follow-up suggested after treatment. Electronically Signed   By: Delbert Phenix M.D.   On: 04/19/2021 09:34    No Known Allergies  Past Medical History:  Diagnosis Date   Medical history non-contributory    Social History   Socioeconomic History   Marital status: Single    Spouse name: Not on file   Number of children: Not on file   Years of education: Not on file   Highest education level: Not on file  Occupational History   Not on file  Tobacco Use   Smoking status: Never   Smokeless tobacco: Never  Substance and Sexual Activity   Alcohol use: No   Drug use: No   Sexual activity: Not Currently  Other Topics Concern   Not on file  Social History Narrative   Not on file   Social Determinants of Health  Financial Resource Strain: Not on file  Food Insecurity: Not on file  Transportation Needs: Not on file  Physical Activity: Not on file  Stress: Not on file  Social Connections: Not on file  Intimate Partner Violence: Not on file   History reviewed. No pertinent family history. Past Surgical History:  Procedure Laterality Date   ABDOMINAL HYSTERECTOMY Bilateral 01/12/2017   Procedure: HYSTERECTOMY ABDOMINAL BILATERAL SALPINGECTOMY;  Surgeon: Schermerhorn, Ihor Austin, MD;  Location: [facility] ORS;  Service: Gynecology;  Laterality: Bilateral;   CESAREAN SECTION       Mardella Layman, MD 04/19/21 1046

## 2021-04-27 ENCOUNTER — Encounter (HOSPITAL_COMMUNITY): Payer: Self-pay | Admitting: *Deleted

## 2021-04-27 ENCOUNTER — Other Ambulatory Visit: Payer: Self-pay

## 2021-04-27 ENCOUNTER — Emergency Department (HOSPITAL_COMMUNITY)
Admission: EM | Admit: 2021-04-27 | Discharge: 2021-04-28 | Disposition: A | Discharge status: Home / Self Care | Payer: Medicaid Other | Source: Home / Self Care | Attending: Emergency Medicine | Admitting: Emergency Medicine

## 2021-04-27 ENCOUNTER — Emergency Department (HOSPITAL_COMMUNITY): Payer: Medicaid Other

## 2021-04-27 DIAGNOSIS — R000 Tachycardia, unspecified: Secondary | ICD-10-CM | POA: Insufficient documentation

## 2021-04-27 DIAGNOSIS — N9489 Other specified conditions associated with female genital organs and menstrual cycle: Secondary | ICD-10-CM | POA: Insufficient documentation

## 2021-04-27 DIAGNOSIS — Z20822 Contact with and (suspected) exposure to covid-19: Secondary | ICD-10-CM | POA: Insufficient documentation

## 2021-04-27 DIAGNOSIS — Z7951 Long term (current) use of inhaled steroids: Secondary | ICD-10-CM | POA: Insufficient documentation

## 2021-04-27 DIAGNOSIS — J029 Acute pharyngitis, unspecified: Secondary | ICD-10-CM | POA: Insufficient documentation

## 2021-04-27 DIAGNOSIS — J4541 Moderate persistent asthma with (acute) exacerbation: Secondary | ICD-10-CM

## 2021-04-27 DIAGNOSIS — D72829 Elevated white blood cell count, unspecified: Secondary | ICD-10-CM | POA: Insufficient documentation

## 2021-04-27 LAB — RESP PANEL BY RT-PCR (FLU A&B, COVID) ARPGX2
Influenza A by PCR: NEGATIVE
Influenza B by PCR: NEGATIVE
SARS Coronavirus 2 by RT PCR: NEGATIVE

## 2021-04-27 MED ORDER — IPRATROPIUM-ALBUTEROL 0.5-2.5 (3) MG/3ML IN SOLN
3.0000 mL | Freq: Once | RESPIRATORY_TRACT | Status: AC
  Administered 2021-04-28: 3 mL via RESPIRATORY_TRACT
  Filled 2021-04-27: qty 3

## 2021-04-27 MED ORDER — PREDNISONE 50 MG PO TABS
60.0000 mg | ORAL_TABLET | Freq: Once | ORAL | Status: AC
  Administered 2021-04-28: 60 mg via ORAL
  Filled 2021-04-27: qty 1

## 2021-04-27 NOTE — ED Notes (Addendum)
Pt states she has been running fevers at home.  Cough is worse per pt and lot of chest congestion.

## 2021-04-27 NOTE — ED Notes (Signed)
Patient transported to X-ray 

## 2021-04-27 NOTE — ED Provider Notes (Signed)
[facility] EMERGENCY DEPARTMENT Provider Note   CSN: [id number] Arrival date & time: 04/27/21  2145     History Chief Complaint  Patient presents with   Shortness of Breath    Charlene Key is a 30 y.o. female.  Patient with a history of questionable asthma here with a 2-week history of cough, shortness of breath, runny nose and sore throat.  She was seen at urgent care on October 8 and treated for possible pneumonia with doxycycline and prednisone which she finished.  She returns tonight with increasing shortness of breath and cough and chest pain with coughing.  States she is using her albuterol on a daily basis up to 4 times daily.  She does not use it normally and has never been diagnosed with asthma or hospitalized for her breathing.  States her doctor gave her albuterol as needed for exercise only.  No abdominal pain, leg pain or leg swelling.  Her cough is productive of clear and yellow mucus.  Does have some congestion and runny nose as well.  No recent sick contacts or travel.  Has felt warm but not checked her temperature.  The history is provided by the patient.  Shortness of Breath Associated symptoms: cough, headaches and sore throat   Associated symptoms: no abdominal pain, no chest pain, no fever, no rash and no vomiting       Past Medical History:  Diagnosis Date   Medical history non-contributory     Patient Active Problem List   Diagnosis Date Noted   Post-operative state 01/12/2017   Abnormal uterine bleeding, postpartum 01/11/2017   History of herpes genitalis 10/27/2016   Pregnancy 10/16/2016   History of preterm delivery 09/27/2015   Short cervix 09/07/2015   Herpes simplex type 2 infection 09/05/2015   Obesity (BMI 30-39.9) 09/05/2015    Past Surgical History:  Procedure Laterality Date   ABDOMINAL HYSTERECTOMY Bilateral 01/12/2017   Procedure: HYSTERECTOMY ABDOMINAL BILATERAL SALPINGECTOMY;  Surgeon: Suzy Bouchard, MD;  Location: [facility] ORS;   Service: Gynecology;  Laterality: Bilateral;   CESAREAN SECTION       OB History     Gravida  7   Para  6   Term  5   Preterm  1   AB      Living  6      SAB      IAB      Ectopic      Multiple  1   Live Births  7           History reviewed. No pertinent family history.  Social History   Tobacco Use   Smoking status: Never   Smokeless tobacco: Never  Substance Use Topics   Alcohol use: No   Drug use: No    Home Medications Prior to Admission medications   Medication Sig Start Date End Date Taking? Authorizing Provider  acetaminophen (TYLENOL) 325 MG tablet Take 650 mg by mouth every 6 (six) hours as needed. 12/24/16   [provider]  albuterol (VENTOLIN HFA) 108 (90 Base) MCG/ACT inhaler Inhale 1-2 puffs into the lungs every 6 (six) hours as needed for wheezing or shortness of breath. 04/19/21   Mardella Layman, MD  doxycycline (VIBRAMYCIN) 100 MG capsule Take 1 capsule (100 mg total) by mouth 2 (two) times daily. 04/19/21   Mardella Layman, MD  predniSONE (DELTASONE) 20 MG tablet Take 2 tablets (40 mg total) by mouth daily. 04/19/21   Mardella Layman, MD    Allergies  Patient has no known allergies.  Review of Systems   Review of Systems  Constitutional:  Positive for fatigue. Negative for activity change, appetite change and fever.  HENT:  Positive for congestion, rhinorrhea and sore throat. Negative for trouble swallowing.   Eyes:  Negative for visual disturbance.  Respiratory:  Positive for cough and shortness of breath.   Cardiovascular:  Negative for chest pain.  Gastrointestinal:  Negative for abdominal pain, nausea and vomiting.  Genitourinary:  Negative for dysuria and hematuria.  Musculoskeletal:  Negative for arthralgias and myalgias.  Skin:  Negative for rash.  Neurological:  Positive for headaches. Negative for dizziness and weakness.   all other systems are negative except as noted in the HPI and PMH.   Physical Exam Updated  Vital Signs BP 119/77 (BP Location: Right Arm)   Pulse (!) 105   Temp 98.7 F (37.1 C) (Oral)   Resp (!) 22   Ht 5\' 3"  (1.6 m)   Wt 104.5 kg   LMP 06/10/2015   SpO2 96%   BMI 40.81 kg/m   Physical Exam Vitals and nursing note reviewed.  Constitutional:      General: She is in acute distress.     Appearance: She is well-developed.     Comments: Mild increased work of breathing  HENT:     Head: Normocephalic and atraumatic.     Mouth/Throat:     Pharynx: No oropharyngeal exudate.  Eyes:     Conjunctiva/sclera: Conjunctivae normal.     Pupils: Pupils are equal, round, and reactive to light.  Neck:     Comments: No meningismus. Cardiovascular:     Rate and Rhythm: Normal rate and regular rhythm.     Heart sounds: Normal heart sounds. No murmur heard. Pulmonary:     Effort: Respiratory distress present.     Breath sounds: Wheezing present.     Comments: Mildly increased work of breathing. Speaking in full sentences Chest:     Chest wall: No tenderness.  Abdominal:     Palpations: Abdomen is soft.     Tenderness: There is no abdominal tenderness. There is no guarding or rebound.  Musculoskeletal:        General: No tenderness. Normal range of motion.     Cervical back: Normal range of motion and neck supple.  Skin:    General: Skin is warm.  Neurological:     Mental Status: She is alert and oriented to person, place, and time.     Cranial Nerves: No cranial nerve deficit.     Motor: No abnormal muscle tone.     Coordination: Coordination normal.     Comments:  5/5 strength throughout. CN 2-12 intact.Equal grip strength.   Psychiatric:        Behavior: Behavior normal.    ED Results / Procedures / Treatments   Labs (all labs ordered are listed, but only abnormal results are displayed) Labs Reviewed  CBC WITH DIFFERENTIAL/PLATELET - Abnormal; Notable for the following components:      Result Value   WBC 20.3 (*)    Neutro Abs 15.9 (*)    Monocytes Absolute 1.5  (*)    Abs Immature Granulocytes 0.10 (*)    All other components within normal limits  BASIC METABOLIC PANEL - Abnormal; Notable for the following components:   Glucose, Bld 154 (*)    All other components within normal limits  RESP PANEL BY RT-PCR (FLU A&B, COVID) ARPGX2  D-DIMER, QUANTITATIVE  HCG, SERUM, QUALITATIVE  EKG None  Radiology DG Chest 2 View  Result Date: 04/27/2021 CLINICAL DATA:  Cough and history of prior pneumonia EXAM: CHEST - 2 VIEW COMPARISON:  04/19/2021 FINDINGS: The heart size and mediastinal contours are within normal limits. Both lungs are clear. The visualized skeletal structures are unremarkable. IMPRESSION: No active cardiopulmonary disease. Electronically Signed   By: Alcide Clever M.D.   On: 04/27/2021 23:07    Procedures Procedures   Medications Ordered in ED Medications  ipratropium-albuterol (DUONEB) 0.5-2.5 (3) MG/3ML nebulizer solution 3 mL (has no administration in time range)  predniSONE (DELTASONE) tablet 60 mg (has no administration in time range)    ED Course  I have reviewed the triage vital signs and the nursing notes.  Pertinent labs & imaging results that were available during my care of the patient were reviewed by me and considered in my medical decision making (see chart for details).    MDM Rules/Calculators/A&P                           2 weeks of cough, shortness of breath, congestion and chills.  No hypoxia or increased work of breathing.  She is mildly tachypneic and wheezing.  Patient given albuterol and steroids.  Chest x-ray shows resolution of previously seen infiltrate.  Labs show leukocytosis which is not unexpected given her steroid use. D-dimer is negative.  Low suspicion for pulmonary embolism or ACS. COVID swab is negative  Mild tachycardia likely secondary albuterol use.  No hypoxia or increased work of breathing.  Able to ambulate without desaturation.  D-dimer is negative.  Will treat with additional  course of steroids for presumed asthma exacerbation.  Continue albuterol.  Follow-up with PCP.  Return precautions discussed.  Charlene Key was evaluated in Emergency Department on 04/27/2021 for the symptoms described in the history of present illness. She was evaluated in the context of the global COVID-19 pandemic, which necessitated consideration that the patient might be at risk for infection with the SARS-CoV-2 virus that causes COVID-19. Institutional protocols and algorithms that pertain to the evaluation of patients at risk for COVID-19 are in a state of rapid change based on information released by regulatory bodies including the CDC and federal and state organizations. These policies and algorithms were followed during the patient's care in the ED.  Final Clinical Impression(s) / ED Diagnoses Final diagnoses:  Moderate persistent asthma with exacerbation    Rx / DC Orders ED Discharge Orders     None        Yuleimy Kretz, Jeannett Senior, MD 04/28/21 [phone]

## 2021-04-27 NOTE — ED Triage Notes (Signed)
Pt states she was dx with PNA at Urgent Care last week.  Pt was prescribed prednisone and doxycyline.  States she is worse than before.

## 2021-04-28 ENCOUNTER — Encounter (HOSPITAL_COMMUNITY): Payer: Self-pay | Admitting: *Deleted

## 2021-04-28 ENCOUNTER — Emergency Department (HOSPITAL_COMMUNITY): Payer: Medicaid Other

## 2021-04-28 ENCOUNTER — Inpatient Hospital Stay (HOSPITAL_COMMUNITY)
Admission: EM | Admit: 2021-04-28 | Discharge: 2021-04-30 | DRG: 309 | Disposition: A | Discharge status: Home / Self Care | Payer: Medicaid Other | Attending: Internal Medicine | Admitting: Internal Medicine

## 2021-04-28 DIAGNOSIS — E559 Vitamin D deficiency, unspecified: Secondary | ICD-10-CM | POA: Diagnosis present

## 2021-04-28 DIAGNOSIS — R000 Tachycardia, unspecified: Principal | ICD-10-CM | POA: Diagnosis present

## 2021-04-28 DIAGNOSIS — Z8701 Personal history of pneumonia (recurrent): Secondary | ICD-10-CM

## 2021-04-28 DIAGNOSIS — Z6841 Body Mass Index (BMI) 40.0 and over, adult: Secondary | ICD-10-CM

## 2021-04-28 DIAGNOSIS — K8689 Other specified diseases of pancreas: Secondary | ICD-10-CM | POA: Diagnosis present

## 2021-04-28 DIAGNOSIS — I471 Supraventricular tachycardia: Principal | ICD-10-CM | POA: Diagnosis present

## 2021-04-28 DIAGNOSIS — Z8616 Personal history of COVID-19: Secondary | ICD-10-CM

## 2021-04-28 DIAGNOSIS — R0602 Shortness of breath: Secondary | ICD-10-CM | POA: Diagnosis present

## 2021-04-28 DIAGNOSIS — R7303 Prediabetes: Secondary | ICD-10-CM | POA: Diagnosis present

## 2021-04-28 DIAGNOSIS — Z20822 Contact with and (suspected) exposure to covid-19: Secondary | ICD-10-CM | POA: Diagnosis present

## 2021-04-28 DIAGNOSIS — T380X5A Adverse effect of glucocorticoids and synthetic analogues, initial encounter: Secondary | ICD-10-CM | POA: Diagnosis present

## 2021-04-28 DIAGNOSIS — K869 Disease of pancreas, unspecified: Secondary | ICD-10-CM | POA: Diagnosis present

## 2021-04-28 DIAGNOSIS — E669 Obesity, unspecified: Secondary | ICD-10-CM | POA: Diagnosis present

## 2021-04-28 LAB — CBC WITH DIFFERENTIAL/PLATELET
Abs Immature Granulocytes: 0.1 10*3/uL — ABNORMAL HIGH (ref 0.00–0.07)
Basophils Absolute: 0 10*3/uL (ref 0.0–0.1)
Basophils Relative: 0 %
Eosinophils Absolute: 0.1 10*3/uL (ref 0.0–0.5)
Eosinophils Relative: 0 %
HCT: 43.9 % (ref 36.0–46.0)
Hemoglobin: 14 g/dL (ref 12.0–15.0)
Immature Granulocytes: 1 %
Lymphocytes Relative: 14 %
Lymphs Abs: 2.8 10*3/uL (ref 0.7–4.0)
MCH: 28 pg (ref 26.0–34.0)
MCHC: 31.9 g/dL (ref 30.0–36.0)
MCV: 87.8 fL (ref 80.0–100.0)
Monocytes Absolute: 1.5 10*3/uL — ABNORMAL HIGH (ref 0.1–1.0)
Monocytes Relative: 7 %
Neutro Abs: 15.9 10*3/uL — ABNORMAL HIGH (ref 1.7–7.7)
Neutrophils Relative %: 78 %
Platelets: 318 10*3/uL (ref 150–400)
RBC: 5 MIL/uL (ref 3.87–5.11)
RDW: 14.6 % (ref 11.5–15.5)
WBC: 20.3 10*3/uL — ABNORMAL HIGH (ref 4.0–10.5)
nRBC: 0 % (ref 0.0–0.2)

## 2021-04-28 LAB — RAPID URINE DRUG SCREEN, HOSP PERFORMED
Amphetamines: NOT DETECTED
Barbiturates: NOT DETECTED
Benzodiazepines: NOT DETECTED
Cocaine: NOT DETECTED
Opiates: NOT DETECTED
Tetrahydrocannabinol: NOT DETECTED

## 2021-04-28 LAB — BASIC METABOLIC PANEL
Anion gap: 9 (ref 5–15)
Anion gap: 10 (ref 5–15)
BUN: 10 mg/dL (ref 6–20)
BUN: 9 mg/dL (ref 6–20)
CO2: 25 mmol/L (ref 22–32)
CO2: 23 mmol/L (ref 22–32)
Calcium: 9.4 mg/dL (ref 8.9–10.3)
Calcium: 9.2 mg/dL (ref 8.9–10.3)
Chloride: 101 mmol/L (ref 98–111)
Chloride: 100 mmol/L (ref 98–111)
Creatinine, Ser: 0.65 mg/dL (ref 0.44–1.00)
Creatinine, Ser: 0.68 mg/dL (ref 0.44–1.00)
GFR, Estimated: >60 mL/min (ref >60)
GFR, Estimated: >60 mL/min (ref >60)
Glucose, Bld: 154 mg/dL — ABNORMAL HIGH (ref 70–99)
Glucose, Bld: 170 mg/dL — ABNORMAL HIGH (ref 70–99)
Potassium: 3.9 mmol/L (ref 3.5–5.1)
Potassium: 4.3 mmol/L (ref 3.5–5.1)
Sodium: 135 mmol/L (ref 135–145)
Sodium: 133 mmol/L — ABNORMAL LOW (ref 135–145)

## 2021-04-28 LAB — CBC
HCT: 47.4 % — ABNORMAL HIGH (ref 36.0–46.0)
Hemoglobin: 14.9 g/dL (ref 12.0–15.0)
MCH: 27.2 pg (ref 26.0–34.0)
MCHC: 31.4 g/dL (ref 30.0–36.0)
MCV: 86.5 fL (ref 80.0–100.0)
Platelets: 321 10*3/uL (ref 150–400)
RBC: 5.48 MIL/uL — ABNORMAL HIGH (ref 3.87–5.11)
RDW: 14.6 % (ref 11.5–15.5)
WBC: 19.8 10*3/uL — ABNORMAL HIGH (ref 4.0–10.5)
nRBC: 0 % (ref 0.0–0.2)

## 2021-04-28 LAB — URINALYSIS, ROUTINE W REFLEX MICROSCOPIC
Bilirubin Urine: NEGATIVE
Glucose, UA: 50 mg/dL — AB
Hgb urine dipstick: NEGATIVE
Ketones, ur: NEGATIVE mg/dL
Leukocytes,Ua: NEGATIVE
Nitrite: NEGATIVE
Protein, ur: NEGATIVE mg/dL
Specific Gravity, Urine: 1.024 (ref 1.005–1.030)
pH: 6 (ref 5.0–8.0)

## 2021-04-28 LAB — MAGNESIUM: Magnesium: 1.9 mg/dL (ref 1.7–2.4)

## 2021-04-28 LAB — TROPONIN I (HIGH SENSITIVITY)
Troponin I (High Sensitivity): 25 ng/L — ABNORMAL HIGH (ref <18)
Troponin I (High Sensitivity): 22 ng/L — ABNORMAL HIGH (ref <18)

## 2021-04-28 LAB — TSH: TSH: 0.466 u[IU]/mL (ref 0.350–4.500)

## 2021-04-28 LAB — D-DIMER, QUANTITATIVE: D-Dimer, Quant: 0.39 ug/mL-FEU (ref 0.00–0.50)

## 2021-04-28 LAB — POC URINE PREG, ED: Preg Test, Ur: NEGATIVE

## 2021-04-28 LAB — BRAIN NATRIURETIC PEPTIDE: B Natriuretic Peptide: 45 pg/mL (ref 0.0–100.0)

## 2021-04-28 LAB — HCG, SERUM, QUALITATIVE: Preg, Serum: NEGATIVE

## 2021-04-28 LAB — LACTIC ACID, PLASMA: Lactic Acid, Venous: 2 mmol/L (ref 0.5–1.9)

## 2021-04-28 MED ORDER — ACETAMINOPHEN 325 MG PO TABS
650.0000 mg | ORAL_TABLET | Freq: Four times a day (QID) | ORAL | Status: DC | PRN
  Administered 2021-04-29 – 2021-04-30 (×3): 650 mg via ORAL
  Filled 2021-04-28 (×3): qty 2

## 2021-04-28 MED ORDER — ADENOSINE 6 MG/2ML IV SOLN
12.0000 mg | Freq: Once | INTRAVENOUS | Status: AC
  Administered 2021-04-28: 12 mg via INTRAVENOUS

## 2021-04-28 MED ORDER — IOHEXOL 350 MG/ML SOLN
100.0000 mL | Freq: Once | INTRAVENOUS | Status: AC | PRN
  Administered 2021-04-28: 75 mL via INTRAVENOUS

## 2021-04-28 MED ORDER — ALBUTEROL SULFATE HFA 108 (90 BASE) MCG/ACT IN AERS: 1.0000 | INHALATION_SPRAY | Freq: Four times a day (QID) | RESPIRATORY_TRACT | 0 refills | Status: DC | PRN

## 2021-04-28 MED ORDER — METHYLPREDNISOLONE SODIUM SUCC 125 MG IJ SOLR: 125.0000 mg | Freq: Once | INTRAMUSCULAR | Status: DC

## 2021-04-28 MED ORDER — ENOXAPARIN SODIUM 40 MG/0.4ML IJ SOSY: 40.0000 mg | PREFILLED_SYRINGE | INTRAMUSCULAR | Status: DC

## 2021-04-28 MED ORDER — SODIUM CHLORIDE 0.9 % IV SOLN: INTRAVENOUS | Status: AC

## 2021-04-28 MED ORDER — IPRATROPIUM-ALBUTEROL 0.5-2.5 (3) MG/3ML IN SOLN: 3.0000 mL | Freq: Four times a day (QID) | RESPIRATORY_TRACT | Status: DC | PRN

## 2021-04-28 MED ORDER — ADENOSINE 6 MG/2ML IV SOLN
INTRAVENOUS | Status: AC
  Administered 2021-04-28: 12 mg via INTRAVENOUS
  Filled 2021-04-28: qty 2

## 2021-04-28 MED ORDER — ONDANSETRON HCL 4 MG/2ML IJ SOLN
4.0000 mg | Freq: Four times a day (QID) | INTRAMUSCULAR | Status: DC | PRN
  Administered 2021-04-30: 4 mg via INTRAVENOUS
  Filled 2021-04-28: qty 2

## 2021-04-28 MED ORDER — POLYETHYLENE GLYCOL 3350 17 G PO PACK: 17.0000 g | PACK | Freq: Every day | ORAL | Status: DC | PRN

## 2021-04-28 MED ORDER — IPRATROPIUM-ALBUTEROL 0.5-2.5 (3) MG/3ML IN SOLN
3.0000 mL | Freq: Once | RESPIRATORY_TRACT | Status: AC
  Administered 2021-04-28: 3 mL via RESPIRATORY_TRACT

## 2021-04-28 MED ORDER — PREDNISONE 50 MG PO TABS: ORAL_TABLET | ORAL | 0 refills | Status: DC

## 2021-04-28 MED ORDER — ONDANSETRON HCL 4 MG PO TABS: 4.0000 mg | ORAL_TABLET | Freq: Four times a day (QID) | ORAL | Status: DC | PRN

## 2021-04-28 MED ORDER — ACETAMINOPHEN 650 MG RE SUPP: 650.0000 mg | Freq: Four times a day (QID) | RECTAL | Status: DC | PRN

## 2021-04-28 MED ORDER — ADENOSINE 6 MG/2ML IV SOLN: 12.0000 mg | Freq: Once | INTRAVENOUS | Status: AC

## 2021-04-28 MED ORDER — ADENOSINE 6 MG/2ML IV SOLN
6.0000 mg | Freq: Once | INTRAVENOUS | Status: AC
  Administered 2021-04-28: 6 mg via INTRAVENOUS

## 2021-04-28 MED ORDER — DILTIAZEM HCL 25 MG/5ML IV SOLN
20.0000 mg | Freq: Once | INTRAVENOUS | Status: AC
  Administered 2021-04-28: 20 mg via INTRAVENOUS
  Filled 2021-04-28: qty 5

## 2021-04-28 MED ORDER — ACETAMINOPHEN 500 MG PO TABS
1000.0000 mg | ORAL_TABLET | Freq: Once | ORAL | Status: AC
  Administered 2021-04-28: 1000 mg via ORAL
  Filled 2021-04-28: qty 2

## 2021-04-28 MED ORDER — GUAIFENESIN-DM 100-10 MG/5ML PO SYRP
10.0000 mL | ORAL_SOLUTION | Freq: Four times a day (QID) | ORAL | Status: AC
  Administered 2021-04-28 – 2021-04-29 (×4): 10 mL via ORAL
  Filled 2021-04-28 (×4): qty 10

## 2021-04-28 MED ORDER — IPRATROPIUM-ALBUTEROL 0.5-2.5 (3) MG/3ML IN SOLN
3.0000 mL | Freq: Once | RESPIRATORY_TRACT | Status: AC
  Administered 2021-04-28: 3 mL via RESPIRATORY_TRACT
  Filled 2021-04-28: qty 3

## 2021-04-28 MED ORDER — SODIUM CHLORIDE 0.9 % IV BOLUS
1000.0000 mL | Freq: Once | INTRAVENOUS | Status: AC
Start: 2021-04-28 — End: 2021-04-28
  Administered 2021-04-28: 1000 mL via INTRAVENOUS

## 2021-04-28 NOTE — ED Notes (Signed)
O2 sats 98% while ambulating 139ft. Pt tolerated well.

## 2021-04-28 NOTE — H&P (Signed)
History and Physical    Charlene Key BWG:[id number] DOB: [birth year] DOA: 04/28/2021  PCP: [facility]   Patient coming from: Home  I have personally briefly reviewed patient's old medical records in [facility]  Chief Complaint: Difficulty breathing  HPI: Charlene Key is a 30 y.o. female with medical history significant for  HSV.  Patient presented to the ED with complaints of persistent difficulty breathing.  Reports symptoms have been present since 8 October.  Difficulty breathing is present mostly with exertion, at rest she is okay.  No chest pains.  No leg swelling.  She also reports a productive cough since October 8. He presented to an urgent care 10/8 chest x-ray showed mild pneumonia, for which she was prescribed doxycycline and prednisone. She was also seen in the ED yesterday for same she was tachypneic and wheezing, with mild tachycardia, D-dimer was negative.  She was given additional course of steroids for presumed asthma exacerbation.  With persistence of symptoms today, patient came back to the ED.  She reports she has never been diagnosed with asthma.  She was given an inhaler by her primary care provider for difficulty breathing with exercise.  She does not smoke cigarettes.  Denies illicit drug use.  Reports good oral intake, no vomiting or loose stools.  ED Course: Patient was tachycardic, documented up to 146, per ED provider heart rate up to 150s to 160s.  Respiratory rate 20-28.  Temperature 99.8.  Blood pressure 106-141.  O2 sats greater than 95% on room air.  Chest x-ray without acute abnormality. ED provider felt patient was in SVT, so adenosine 6 mg and then 12 mg x 2 was given without improvement in her heart rate.  And IV Cardizem 20 mg was given.  Heart rate now in the 130s.  EDP talked to cardiology, did not feel patient was in SVT. Hospitalist to admit for dyspnea and tachycardia.  Review of Systems: As per HPI all other systems reviewed and  negative.  Past Medical History:  Diagnosis Date   Medical history non-contributory     Past Surgical History:  Procedure Laterality Date   ABDOMINAL HYSTERECTOMY Bilateral 01/12/2017   Procedure: HYSTERECTOMY ABDOMINAL BILATERAL SALPINGECTOMY;  Surgeon: Schermerhorn, Ihor Austin, MD;  Location: [facility] ORS;  Service: Gynecology;  Laterality: Bilateral;   CESAREAN SECTION       reports that she has never smoked. She has never used smokeless tobacco. She reports that she does not drink alcohol and does not use drugs.  No Known Allergies  No known family history of cancer.  Prior to Admission medications   Medication Sig Start Date End Date Taking? Authorizing Provider  ferrous sulfate 324 MG TBEC Take by mouth. 09/29/15  Yes [provider]  acetaminophen (TYLENOL) 325 MG tablet Take 650 mg by mouth every 6 (six) hours as needed. 12/24/16   [provider]  albuterol (VENTOLIN HFA) 108 (90 Base) MCG/ACT inhaler Inhale 1-2 puffs into the lungs every 6 (six) hours as needed for wheezing or shortness of breath. 04/28/21   Rancour, Jeannett Senior, MD  doxycycline (VIBRAMYCIN) 100 MG capsule Take 1 capsule (100 mg total) by mouth 2 (two) times daily. 04/19/21   Mardella Layman, MD  predniSONE (DELTASONE) 50 MG tablet 1 tablet PO daily 04/28/21   Rancour, Jeannett Senior, MD  Prenat w/o A-FE-Methfol-FA-DHA (PNV-DHA) 27-0.6-0.4-300 MG CAPS Take 1 capsule by mouth daily.    [provider]    Physical Exam: Vitals:   04/28/21 1636 04/28/21 1700 04/28/21  1730 04/28/21 1800  BP:   121/71 127/69  Pulse:  (!) 146 (!) 137 (!) 134  Resp:  (!) 28 (!) 26 20  Temp:      TempSrc: Oral     SpO2:  99% 98% 95%  Weight:      Height:        Constitutional: NAD, calm, comfortable Vitals:   04/28/21 1636 04/28/21 1700 04/28/21 1730 04/28/21 1800  BP:   121/71 127/69  Pulse:  (!) 146 (!) 137 (!) 134  Resp:  (!) 28 (!) 26 20  Temp:      TempSrc: Oral     SpO2:  99% 98% 95%  Weight:       Height:       Eyes: PERRL, lids and conjunctivae normal ENMT: Mucous membranes are mildly dry.   Neck: normal, supple, no masses, no thyromegaly Respiratory: Does not appear dyspneic, talking in full sentences, very faint sparse expiratory wheezing, no crackles. Normal respiratory effort. No accessory muscle use.  Cardiovascular: Tachycardic heart rate currently 90 high 120s, regular rate and rhythm, no murmurs / rubs / gallops. No extremity edema. 2+ pedal pulses. No carotid bruits.  Abdomen: no tenderness, no masses palpated. No hepatosplenomegaly. Bowel sounds positive.  Musculoskeletal: no clubbing / cyanosis. No joint deformity upper and lower extremities. Good ROM, no contractures. Normal muscle tone.  Skin: no rashes, lesions, ulcers. No induration Neurologic: No apparent cranial nerve abnormality, moving extremities spontaneously Psychiatric: Normal judgment and insight. Alert and oriented x 3. Normal mood.   Labs on Admission: I have personally reviewed following labs and imaging studies  CBC: Recent Labs  Lab 04/27/21 2316 04/28/21 1440  WBC 20.3* 19.8*  NEUTROABS 15.9*  --   HGB 14.0 14.9  HCT 43.9 47.4*  MCV 87.8 86.5  PLT 318 321   Basic Metabolic Panel: Recent Labs  Lab 04/27/21 2316 04/28/21 1440  NA 135 133*  K 3.9 4.3  CL 101 100  CO2 25 23  GLUCOSE 154* 170*  BUN 10 9  CREATININE 0.65 0.68  CALCIUM 9.4 9.2    Radiological Exams on Admission: DG Chest 2 View  Result Date: 04/27/2021 CLINICAL DATA:  Cough and history of prior pneumonia EXAM: CHEST - 2 VIEW COMPARISON:  04/19/2021 FINDINGS: The heart size and mediastinal contours are within normal limits. Both lungs are clear. The visualized skeletal structures are unremarkable. IMPRESSION: No active cardiopulmonary disease. Electronically Signed   By: Alcide Clever M.D.   On: 04/27/2021 23:07   CT Angio Chest PE W/Cm &/Or Wo Cm  Result Date: 04/28/2021 CLINICAL DATA:  Chest pressure and shortness of  breath for the past 2 weeks. EXAM: CT ANGIOGRAPHY CHEST WITH CONTRAST TECHNIQUE: Multidetector CT imaging of the chest was performed using the standard protocol during bolus administration of intravenous contrast. Multiplanar CT image reconstructions and MIPs were obtained to evaluate the vascular anatomy. CONTRAST:  57mL OMNIPAQUE IOHEXOL 350 MG/ML SOLN COMPARISON:  Chest x-ray from same day. FINDINGS: Cardiovascular: Satisfactory opacification of the pulmonary arteries to the segmental level. No evidence of pulmonary embolism. Normal heart size. No pericardial effusion. No thoracic aortic aneurysm or dissection. Mediastinum/Nodes: No enlarged mediastinal, hilar, or axillary lymph nodes. Thyroid gland, trachea, and esophagus demonstrate no significant findings. Lungs/Pleura: Minimal subsegmental atelectasis and/or scarring in the medial right middle lobe. No focal consolidation, pleural effusion, or pneumothorax. Upper Abdomen: No acute abnormality. Incompletely visualized 3.3 x 2.1 cm mass that appears to arise from the pancreatic body (series 4,  image 99). Musculoskeletal: No chest wall abnormality. No acute or significant osseous findings. Review of the MIP images confirms the above findings. IMPRESSION: 1. No evidence of pulmonary embolism. No acute intrathoracic process. 2. 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 Recommend further evaluation with outpatient pancreatic protocol CT or MRI with and without contrast. Electronically Signed   By: Obie Dredge M.D.   On: 04/28/2021 17:31   DG Chest Port 1 View  Result Date: 04/28/2021 CLINICAL DATA:  Shortness of breath. EXAM: PORTABLE CHEST 1 VIEW COMPARISON:  Chest x-ray 04/27/2021. FINDINGS: The heart size and mediastinal contours are within normal limits. Both lungs are clear. The visualized skeletal structures are unremarkable. IMPRESSION: No active disease. Electronically Signed   By: Darliss Cheney M.D.    On: 04/28/2021 16:31    EKG: Independently reviewed.  Sinus tachycardia 142, QTc 415. ST or T wave abnormality.  Assessment/Plan Principal Problem:   Tachycardia Active Problems:   Questionable Mass of pancreas   Tachycardia-elevated up to 160s by ED provider.  Received 6 mg of adenosine x1, 12 mg x 2 without improvement in heart rate.  On my read, appears to be sinus tachycardia on EKG. At this time, etiology of her significant tachycardia not identified.  Heart rate currently in the high 120s, s/p- adenosine, 20 mg Cardizem and 1 L bolus given. But she does not appear dehydrated, and no history to suggest same.  Magnesium 1.9.  Potassium 4.3.  Hemoglobin 14.9.  Denies illicit drug use. - Continue hydration with N/s 100cc/hr x 15hrs -Follow-up UDS -Follow-up TSH -Obtain echocardiogram -Pending clinical course may need cardiology evaluation -Hold further steroids for now  Recent pneumonia -may explain her persistent productive cough.  Dyspnea on exertion likely related to her tachycardia.  Lung exam- very faint mild and sparse expiratory wheezing.  Chest X-ray 04/19/2021 showed faint right middle lobe opacities suggesting mild pneumonia.  She was treated with doxycycline.  CTA chest today negative for PE or other intra-thoracic process.   -DuoNebs as needed -Mucolytic's  Leukocytosis-19.8.No focus of infection identified. ??  Secondary to steroids. -Trend  ?? 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 Recommend further evaluation with outpatient pancreatic protocol CT or MRI with and without contrast.  He is unaware of any family history of cancers, including abdominal cancers.   -I explained findings to patient, and that this needs to be followed up. -Problem list updated    DVT prophylaxis: Lovenox Code Status: Full code Family Communication: Spouse at bedside Disposition Plan: ~ 1-2 days Consults called: None   Admission status: Obs stepdown ( due to severity of tachycardia, would not be accepted on regular floors).    Onnie Boer MD [facility]  04/28/2021, 8:46 PM

## 2021-04-28 NOTE — ED Notes (Signed)
Date and time results received: 04/28/21 1850   Test: Lactic acid  Critical Value: 2.0  Name of Provider Notified: Dr. Rhunette Croft  Orders Received? Or Actions Taken?:  Cancel next lactic

## 2021-04-28 NOTE — ED Triage Notes (Signed)
Seen last night for shortness of breath, return visit for same

## 2021-04-28 NOTE — ED Provider Notes (Signed)
[facility] EMERGENCY DEPARTMENT Provider Note   CSN: [id number] Arrival date & time: 04/28/21  1301     History Chief Complaint  Patient presents with   Shortness of Breath    Charlene Key is a 30 y.o. female.  HPI  Patient presents with shortness of breath.  She been feeling short of breath the last few days, was seen yesterday in the emergency department.  Patient reports she was discharged with prednisone.  Took it this morning, denies any improvement.  She has been using her albuterol inhaler every hour without improvement.  States that exertion makes it worse.  Denies any chest pain.  Patient was previously on doxycycline for pneumonia 2 weeks ago, history of COVID diagnosis months ago.  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  Social History   Tobacco Use   Smoking status: Never   Smokeless tobacco: Never  Substance Use Topics   Alcohol use: No   Drug use: No    Home Medications Prior to Admission medications   Medication Sig Start Date End Date Taking? Authorizing Provider  ferrous  sulfate 324 MG TBEC Take by mouth. 09/29/15  Yes [provider]  acetaminophen (TYLENOL) 325 MG tablet Take 650 mg by mouth every 6 (six) hours as needed. 12/24/16   [provider]  albuterol (VENTOLIN HFA) 108 (90 Base) MCG/ACT inhaler Inhale 1-2 puffs into the lungs every 6 (six) hours as needed for wheezing or shortness of breath. 04/28/21   Rancour, Jeannett Senior, MD  doxycycline (VIBRAMYCIN) 100 MG capsule Take 1 capsule (100 mg total) by mouth 2 (two) times daily. 04/19/21   Mardella Layman, MD  predniSONE (DELTASONE) 50 MG tablet 1 tablet PO daily 04/28/21   Rancour, Jeannett Senior, MD  Prenat w/o A-FE-Methfol-FA-DHA (PNV-DHA) 27-0.6-0.4-300 MG CAPS Take 1 capsule by mouth daily.    [provider]    Allergies    Patient has no known allergies.  Review of Systems   Review of Systems  Constitutional:  Negative for chills, fatigue and fever.  HENT:  Positive for congestion. Negative for ear pain.   Eyes:  Negative for pain and visual disturbance.  Respiratory:  Positive for cough and shortness of breath.   Cardiovascular:  Positive for palpitations. Negative for chest pain.  Gastrointestinal:  Negative for abdominal pain, nausea and vomiting.  Genitourinary:  Negative  for dysuria and hematuria.  Musculoskeletal:  Negative for arthralgias and back pain.  Skin:  Negative for color change and rash.  Neurological:  Negative for seizures and syncope.  All other systems reviewed and are negative.  Physical Exam Updated Vital Signs BP 127/69   Pulse (!) 134   Temp 99.8 F (37.7 C) (Oral)   Resp 20   Ht 5\' 3"  (1.6 m)   Wt 104.3 kg   LMP 06/10/2015   SpO2 95%   BMI 40.74 kg/m   Physical Exam Vitals and nursing note reviewed. Exam conducted with a chaperone present.  Constitutional:      Appearance: Normal appearance.  HENT:     Head: Normocephalic and atraumatic.  Eyes:     General: No scleral icterus.       Right eye: No discharge.        Left eye: No  discharge.     Extraocular Movements: Extraocular movements intact.     Pupils: Pupils are equal, round, and reactive to light.  Cardiovascular:     Rate and Rhythm: Regular rhythm. Tachycardia present.     Pulses: Normal pulses.     Heart sounds: Normal heart sounds. No murmur heard.   No friction rub. No gallop.     Comments: Patient sinus tach, heart rate between 140 and 160 in the room. Pulmonary:     Effort: Pulmonary effort is normal. Tachypnea present. No respiratory distress.     Breath sounds: Wheezing present.     Comments: No accessory muscle use, patient is mildly tachypneic.  There are wheezes bilaterally to the lung fields. Abdominal:     General: Abdomen is flat. Bowel sounds are normal. There is no distension.     Palpations: Abdomen is soft.     Tenderness: There is no abdominal tenderness.  Skin:    General: Skin is warm and dry.     Coloration: Skin is not jaundiced.  Neurological:     Mental Status: She is alert. Mental status is at baseline.     Coordination: Coordination normal.    ED Results / Procedures / Treatments   Labs (all labs ordered are listed, but only abnormal results are displayed) Labs Reviewed  BASIC METABOLIC PANEL - Abnormal; Notable for the following components:      Result Value   Sodium 133 (*)    Glucose, Bld 170 (*)    All other components within normal limits  CBC - Abnormal; Notable for the following components:   WBC 19.8 (*)    RBC 5.48 (*)    HCT 47.4 (*)    All other components within normal limits  TROPONIN I (HIGH SENSITIVITY) - Abnormal; Notable for the following components:   Troponin I (High Sensitivity) 25 (*)    All other components within normal limits  CULTURE, BLOOD (ROUTINE X 2)  CULTURE, BLOOD (ROUTINE X 2)  LACTIC ACID, PLASMA  LACTIC ACID, PLASMA  URINALYSIS, ROUTINE W REFLEX MICROSCOPIC  TSH  MAGNESIUM  RAPID URINE DRUG SCREEN, HOSP PERFORMED  BRAIN NATRIURETIC PEPTIDE  POC URINE PREG, ED  I-STAT BETA  HCG BLOOD, ED ([facility], [facility], [facility] ONLY)  TROPONIN I (HIGH SENSITIVITY)    EKG EKG Interpretation  Date/Time:  Monday April 28 2021 14:11:36 EDT Ventricular Rate:  142 PR Interval:  142 QRS Duration: 78 QT Interval:  270 QTC Calculation: 415 R Axis:   1 Text Interpretation: Biatrial enlargement Supraventricular tachycardia Abnormal ECG No prior tracing available Confirmed by [id number] (  [id number]) on 04/28/2021 2:14:52 PM  Radiology DG Chest 2 View  Result Date: 04/27/2021 CLINICAL DATA:  Cough and history of prior pneumonia EXAM: CHEST - 2 VIEW COMPARISON:  04/19/2021 FINDINGS: The heart size and mediastinal contours are within normal limits. Both lungs are clear. The visualized skeletal structures are unremarkable. IMPRESSION: No active cardiopulmonary disease. Electronically Signed   By: Alcide Clever M.D.   On: 04/27/2021 23:07   CT Angio Chest PE W/Cm &/Or Wo Cm  Result Date: 04/28/2021 CLINICAL DATA:  Chest pressure and shortness of breath for the past 2 weeks. EXAM: CT ANGIOGRAPHY CHEST WITH CONTRAST TECHNIQUE: Multidetector CT imaging of the chest was performed using the standard protocol during bolus administration of intravenous contrast. Multiplanar CT image reconstructions and MIPs were obtained to evaluate the vascular anatomy. CONTRAST:  62mL OMNIPAQUE IOHEXOL 350 MG/ML SOLN COMPARISON:  Chest x-ray from same day. FINDINGS: Cardiovascular: Satisfactory opacification of the pulmonary arteries to the segmental level. No evidence of pulmonary embolism. Normal heart size. No pericardial effusion. No thoracic aortic aneurysm or dissection. Mediastinum/Nodes: No enlarged mediastinal, hilar, or axillary lymph nodes. Thyroid gland, trachea, and esophagus demonstrate no significant findings. Lungs/Pleura: Minimal subsegmental atelectasis and/or scarring in the medial right middle lobe. No focal consolidation, pleural effusion, or pneumothorax. Upper Abdomen: No acute abnormality. Incompletely  visualized 3.3 x 2.1 cm mass that appears to arise from the pancreatic body (series 4, image 99). Musculoskeletal: No chest wall abnormality. No acute or significant osseous findings. Review of the MIP images confirms the above findings. IMPRESSION: 1. No evidence of pulmonary embolism. No acute intrathoracic process. 2. 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 Recommend further evaluation with outpatient pancreatic protocol CT or MRI with and without contrast. Electronically Signed   By: Obie Dredge M.D.   On: 04/28/2021 17:31   DG Chest Port 1 View  Result Date: 04/28/2021 CLINICAL DATA:  Shortness of breath. EXAM: PORTABLE CHEST 1 VIEW COMPARISON:  Chest x-ray 04/27/2021. FINDINGS: The heart size and mediastinal contours are within normal limits. Both lungs are clear. The visualized skeletal structures are unremarkable. IMPRESSION: No active disease. Electronically Signed   By: Darliss Cheney M.D.   On: 04/28/2021 16:31    Procedures .Critical Care Performed by: Theron Arista, PA-C Authorized by: Theron Arista, PA-C   Critical care provider statement:    Critical care time (minutes): 40.   Critical care start time:  04/28/2021 4:31 PM   Critical care end time:  04/28/2021 5:20 AM   Critical care time was exclusive of:  Separately billable procedures and treating other patients   Critical care was necessary to treat or prevent imminent or life-threatening deterioration of the following conditions:  Cardiac failure   Critical care was time spent personally by me on the following activities:  Development of treatment plan with patient or surrogate, discussions with consultants, evaluation of patient's response to treatment, examination of patient, obtaining history from patient or surrogate, ordering and performing treatments and interventions, ordering and review of laboratory studies, ordering and review of radiographic studies, re-evaluation of  patient's condition and review of old charts   Care discussed with: admitting provider     Medications Ordered in ED Medications  diltiazem (CARDIZEM) injection 20 mg (has no administration in time range)  adenosine (ADENOCARD) 6 MG/2ML injection 6 mg (6 mg Intravenous Given 04/28/21 1530)  sodium chloride 0.9 % bolus 1,000 mL (0 mLs Intravenous Stopped 04/28/21 1706)  adenosine (ADENOCARD)  6 MG/2ML injection 12 mg (12 mg Intravenous Given 04/28/21 1536)  adenosine (ADENOCARD) 6 MG/2ML injection 12 mg (12 mg Intravenous Given 04/28/21 1542)  iohexol (OMNIPAQUE) 350 MG/ML injection 100 mL (75 mLs Intravenous Contrast Given 04/28/21 1717)    ED Course  I have reviewed the triage vital signs and the nursing notes.  Pertinent labs & imaging results that were available during my care of the patient were reviewed by me and considered in my medical decision making (see chart for details).  Clinical Course as of 04/28/21 Halford Chessman Apr 28, 2021  1758 CT Angio Chest PE W/Cm &/Or Wo Cm Pancreatic mass visualized, but no evidence pneumonia or PE. [HS]    Clinical Course User Index [HS] Theron Arista, PA-C   MDM Rules/Calculators/A&P                           Patient was critically ill, tachycardic and tachypneic.  And there is initial consideration the patient was in SVT, adenosine was administered with attending at the time Dr. Tanda Rockers.  Patient failed to convert.  Discussed the case with the cardiologist, cardiologist reviewed the EKG and believes patient is in sinus tach.  Patient has been tachycardic throughout the ED stay, started on Cardizem drip.  Patient was seen yesterday in the emergency department, she did have dimer at that time which was negative.  However, given tachycardia and increased shortness of breath we will proceed with CTA.  We will also get TSH and magnesium labs.   CTA negative for underlying infection or PE.  Patient remains tachycardic despite interventions.   Possible that the persistent shortness of breath secondary due to COPD from 6 weeks ago, there is appear to be an underlying pneumonia.  She also has asthma so it is possibly an exacerbation especially given her wheezing.  However, patient will need work-up for prolonged tachycardia.  Will discuss with the hospitalist and plan for admission.  Final Clinical Impression(s) / ED Diagnoses Final diagnoses:  SOB (shortness of breath)    Rx / DC Orders ED Discharge Orders     None        Theron Arista, PA-C 04/28/21 1839    Derwood Kaplan, MD 05/06/21 1044

## 2021-04-28 NOTE — Discharge Instructions (Signed)
Take the steroids as prescribed.  Use your albuterol every 4 hours as needed.  Follow-up with your primary doctor.  Return to ED with difficulty breathing, chest pain, not able to eat or drink or any other concerns

## 2021-04-29 ENCOUNTER — Inpatient Hospital Stay (HOSPITAL_COMMUNITY): Payer: Medicaid Other

## 2021-04-29 ENCOUNTER — Encounter (HOSPITAL_COMMUNITY): Payer: Self-pay | Admitting: Family Medicine

## 2021-04-29 ENCOUNTER — Telehealth: Payer: Self-pay | Admitting: Gastroenterology

## 2021-04-29 DIAGNOSIS — R000 Tachycardia, unspecified: Secondary | ICD-10-CM | POA: Diagnosis not present

## 2021-04-29 DIAGNOSIS — K869 Disease of pancreas, unspecified: Secondary | ICD-10-CM

## 2021-04-29 DIAGNOSIS — I471 Supraventricular tachycardia: Secondary | ICD-10-CM | POA: Diagnosis present

## 2021-04-29 DIAGNOSIS — R0602 Shortness of breath: Secondary | ICD-10-CM

## 2021-04-29 DIAGNOSIS — Z8701 Personal history of pneumonia (recurrent): Secondary | ICD-10-CM | POA: Diagnosis not present

## 2021-04-29 DIAGNOSIS — K8689 Other specified diseases of pancreas: Secondary | ICD-10-CM | POA: Diagnosis not present

## 2021-04-29 DIAGNOSIS — Z8616 Personal history of COVID-19: Secondary | ICD-10-CM | POA: Diagnosis not present

## 2021-04-29 DIAGNOSIS — Z6841 Body Mass Index (BMI) 40.0 and over, adult: Secondary | ICD-10-CM | POA: Diagnosis not present

## 2021-04-29 DIAGNOSIS — R7303 Prediabetes: Secondary | ICD-10-CM | POA: Diagnosis present

## 2021-04-29 DIAGNOSIS — E559 Vitamin D deficiency, unspecified: Secondary | ICD-10-CM | POA: Diagnosis present

## 2021-04-29 DIAGNOSIS — Z20822 Contact with and (suspected) exposure to covid-19: Secondary | ICD-10-CM | POA: Diagnosis present

## 2021-04-29 DIAGNOSIS — T380X5A Adverse effect of glucocorticoids and synthetic analogues, initial encounter: Secondary | ICD-10-CM | POA: Diagnosis present

## 2021-04-29 DIAGNOSIS — E669 Obesity, unspecified: Secondary | ICD-10-CM | POA: Diagnosis present

## 2021-04-29 LAB — VITAMIN D 25 HYDROXY (VIT D DEFICIENCY, FRACTURES): Vit D, 25-Hydroxy: 13.42 ng/mL — ABNORMAL LOW (ref 30–100)

## 2021-04-29 LAB — BASIC METABOLIC PANEL
Anion gap: 6 (ref 5–15)
BUN: 8 mg/dL (ref 6–20)
CO2: 26 mmol/L (ref 22–32)
Calcium: 8.5 mg/dL — ABNORMAL LOW (ref 8.9–10.3)
Chloride: 105 mmol/L (ref 98–111)
Creatinine, Ser: 0.58 mg/dL (ref 0.44–1.00)
GFR, Estimated: >60 mL/min (ref >60)
Glucose, Bld: 112 mg/dL — ABNORMAL HIGH (ref 70–99)
Potassium: 3.8 mmol/L (ref 3.5–5.1)
Sodium: 137 mmol/L (ref 135–145)

## 2021-04-29 LAB — CBC
HCT: 42.3 % (ref 36.0–46.0)
Hemoglobin: 13.3 g/dL (ref 12.0–15.0)
MCH: 27.5 pg (ref 26.0–34.0)
MCHC: 31.4 g/dL (ref 30.0–36.0)
MCV: 87.4 fL (ref 80.0–100.0)
Platelets: 284 10*3/uL (ref 150–400)
RBC: 4.84 MIL/uL (ref 3.87–5.11)
RDW: 14.9 % (ref 11.5–15.5)
WBC: 14.8 10*3/uL — ABNORMAL HIGH (ref 4.0–10.5)
nRBC: 0 % (ref 0.0–0.2)

## 2021-04-29 LAB — RAPID URINE DRUG SCREEN, HOSP PERFORMED
Amphetamines: NOT DETECTED
Barbiturates: NOT DETECTED
Benzodiazepines: NOT DETECTED
Cocaine: NOT DETECTED
Opiates: NOT DETECTED
Tetrahydrocannabinol: NOT DETECTED

## 2021-04-29 LAB — URINALYSIS, COMPLETE (UACMP) WITH MICROSCOPIC
Bilirubin Urine: NEGATIVE
Glucose, UA: NEGATIVE mg/dL
Ketones, ur: NEGATIVE mg/dL
Nitrite: NEGATIVE
Protein, ur: NEGATIVE mg/dL
Specific Gravity, Urine: 1.01 (ref 1.005–1.030)
pH: 6.5 (ref 5.0–8.0)

## 2021-04-29 LAB — HEMOGLOBIN A1C
Hgb A1c MFr Bld: 5.8 % — ABNORMAL HIGH (ref 4.8–5.6)
Mean Plasma Glucose: 119.76 mg/dL

## 2021-04-29 LAB — ECHOCARDIOGRAM COMPLETE
Area-P 1/2: 6.65 cm2
Height: 63 in
S' Lateral: 2.6 cm
Weight: 3636.71 oz

## 2021-04-29 LAB — LACTIC ACID, PLASMA: Lactic Acid, Venous: 1.8 mmol/L (ref 0.5–1.9)

## 2021-04-29 LAB — SEDIMENTATION RATE: Sed Rate: 32 mm/hr — ABNORMAL HIGH (ref 0–22)

## 2021-04-29 LAB — HIV ANTIBODY (ROUTINE TESTING W REFLEX): HIV Screen 4th Generation wRfx: NONREACTIVE

## 2021-04-29 LAB — C-REACTIVE PROTEIN: CRP: 0.8 mg/dL (ref <1.0)

## 2021-04-29 LAB — MRSA NEXT GEN BY PCR, NASAL: MRSA by PCR Next Gen: NOT DETECTED

## 2021-04-29 LAB — PROCALCITONIN: Procalcitonin: <0.1 ng/mL

## 2021-04-29 MED ORDER — CHLORHEXIDINE GLUCONATE CLOTH 2 % EX PADS
6.0000 | MEDICATED_PAD | Freq: Every day | CUTANEOUS | Status: DC
  Administered 2021-04-29 – 2021-04-30 (×2): 6 via TOPICAL

## 2021-04-29 MED ORDER — VITAMIN D (ERGOCALCIFEROL) 1.25 MG (50000 UNIT) PO CAPS
50000.0000 [IU] | ORAL_CAPSULE | ORAL | Status: DC
  Administered 2021-04-29: 50000 [IU] via ORAL
  Filled 2021-04-29 (×2): qty 1

## 2021-04-29 MED ORDER — METOPROLOL TARTRATE 25 MG PO TABS
25.0000 mg | ORAL_TABLET | Freq: Two times a day (BID) | ORAL | Status: DC
  Administered 2021-04-29 – 2021-04-30 (×2): 25 mg via ORAL
  Filled 2021-04-29 (×2): qty 1

## 2021-04-29 NOTE — Consult Note (Signed)
@LOGO @   Referring Provider: [facility]  Primary Care Physician:  [facility]ologist:  Dr. Lanier Prude (previously unassigned)   Date of Admission: 04/28/2021 Date of Consultation: 04/29/21  Reason for Consultation:  New Pancreatic mass  HPI:  05/01/21 Charlene Key is a 30 y.o. year old female  with medical history significant for  HSV.  Patient presented to the ED with complaints of persistent difficulty breathing, productive cough since October 8th.  Prior evaluation at urgent care on 10/8 with chest x-ray showing mild pneumonia for which she was prescribed doxycycline and prednisone.  Also seen in the ED 10/16 for the same.  She was found tachypneic and wheezing, mild tachycardia, D-dimer negative.  She was given additional course of steroids for presumed asthma exacerbation.  Due to persistent symptoms, she return to the ED.  ED Course: Patient was tachycardic, documented up to 146, per ED provider heart rate up to 150s to 160s.  Respiratory rate 20-28.  Temperature 99.8.  Blood pressure 106-141.  O2 sats greater than 95% on room air.  Chest x-ray without acute abnormality. ED provider felt patient was in SVT, so adenosine 6 mg and then 12 mg x 2 was given without improvement in her heart rate.  And IV Cardizem 20 mg was given.  Heart rate improved somewhat.  CTA chest negative for PE.  Noted incompletely visualized 3.3 x 2.1 cm mass that appears to arise from pancreatic body with recommendations for outpatient pancreatic protocol CT or MRI with and without contrast. She had no significant laboratory abnormalities aside from leukocytosis, possibly secondary to steroids.  Blood cultures drawn. EDP talked to cardiology, did not feel patient was in SVT. Hospitalist to admit for dyspnea and tachycardia.  She started on IV hydration, TSH, UDS, and echocardiogram ordered.   Today:  Patient reports she continues with shortness of breath, unchanged since admission.   Ongoing cough, productive of sputum that has become a little more yellow and tan.  Also with nasal congestion and head pressure.  Denies fever or chills.  Denies any significant GI history.  She reports a couple months ago, she noticed her stools became more mushy, but denies diarrhea.  Typically with 2 bowel movements per day, sometimes incomplete, but had a good BM this morning. No medication changes, antibiotics, travel around the time of bowel habit changes.  Denies BRBPR or melena.  Denies abdominal pain, unintentional weight loss, nausea, vomiting, yellowing of the eyes, dark urine, GERD symptoms, dysphagia.  No known family history of pancreatic or any other GI cancers.  No personal history of cancer.   Past Medical History:  Diagnosis Date   Medical history non-contributory     Past Surgical History:  Procedure Laterality Date   ABDOMINAL HYSTERECTOMY Bilateral 01/12/2017   Procedure: HYSTERECTOMY ABDOMINAL BILATERAL SALPINGECTOMY;  Surgeon: Schermerhorn, 03/15/2017, MD;  Location: [facility] ORS;  Service: Gynecology;  Laterality: Bilateral;   CESAREAN SECTION      Prior to Admission medications   Medication Sig Start Date End Date Taking? Authorizing Provider  acetaminophen (TYLENOL) 325 MG tablet Take 650 mg by mouth every 6 (six) hours as needed. 12/24/16  Yes [provider]  albuterol (VENTOLIN HFA) 108 (90 Base) MCG/ACT inhaler Inhale 1-2 puffs into the lungs every 6 (six) hours as needed for wheezing or shortness of breath. 04/28/21  Yes Rancour, 04/30/21, MD  predniSONE (DELTASONE) 50 MG tablet 1 tablet PO daily 04/28/21  Yes Rancour, 04/30/21, MD  doxycycline (VIBRAMYCIN) 100 MG capsule Take  1 capsule (100 mg total) by mouth 2 (two) times daily. Patient not taking: No sig reported 04/19/21   Mardella Layman, MD  ferrous sulfate 324 MG TBEC Take by mouth. Patient not taking: No sig reported 09/29/15   [provider]  Prenat w/o A-FE-Methfol-FA-DHA (PNV-DHA) 27-0.6-0.4-300  MG CAPS Take 1 capsule by mouth daily. Patient not taking: No sig reported    [provider]    Current Facility-Administered Medications  Medication Dose Route Frequency Provider Last Rate Last Admin   0.9 %  sodium chloride infusion   Intravenous Continuous Emokpae, Ejiroghene E, MD 100 mL/hr at 04/28/21 2341 New Bag at 04/28/21 2341   acetaminophen (TYLENOL) tablet 650 mg  650 mg Oral Q6H PRN Emokpae, Ejiroghene E, MD   650 mg at 04/29/21 8938   Or   acetaminophen (TYLENOL) suppository 650 mg  650 mg Rectal Q6H PRN Emokpae, Ejiroghene E, MD       Chlorhexidine Gluconate Cloth 2 % PADS 6 each  6 each Topical Daily Emokpae, Ejiroghene E, MD       enoxaparin (LOVENOX) injection 40 mg  40 mg Subcutaneous Q24H Emokpae, Ejiroghene E, MD       guaiFENesin-dextromethorphan (ROBITUSSIN DM) 100-10 MG/5ML syrup 10 mL  10 mL Oral Q6H Emokpae, Ejiroghene E, MD   10 mL at 04/29/21 0524   ipratropium-albuterol (DUONEB) 0.5-2.5 (3) MG/3ML nebulizer solution 3 mL  3 mL Nebulization Q6H PRN Emokpae, Ejiroghene E, MD       ondansetron (ZOFRAN) tablet 4 mg  4 mg Oral Q6H PRN Emokpae, Ejiroghene E, MD       Or   ondansetron (ZOFRAN) injection 4 mg  4 mg Intravenous Q6H PRN Emokpae, Ejiroghene E, MD       polyethylene glycol (MIRALAX / GLYCOLAX) packet 17 g  17 g Oral Daily PRN Emokpae, Ejiroghene E, MD        Allergies as of 04/28/2021   (No Known Allergies)    No family history on file.  Social History   Socioeconomic History   Marital status: Single    Spouse name: Not on file   Number of children: Not on file   Years of education: Not on file   Highest education level: Not on file  Occupational History   Not on file  Tobacco Use   Smoking status: Never   Smokeless tobacco: Never  Substance and Sexual Activity   Alcohol use: No   Drug use: No   Sexual activity: Not Currently  Other Topics Concern   Not on file  Social History Narrative   Not on file   Social Determinants of  Health   Financial Resource Strain: Not on file  Food Insecurity: Not on file  Transportation Needs: Not on file  Physical Activity: Not on file  Stress: Not on file  Social Connections: Not on file  Intimate Partner Violence: Not on file    Review of Systems: Gen: See HPI  CV: Denies chest pain.  Denies heart palpitations. Resp: See HPI. GI: See HPI GU : Denies urinary burning, urinary frequency, urinary incontinence.  MS: Denies joint pain Derm: Denies rash, Psych: Denies depression, anxiety. Heme: See HPI  Physical Exam: Vital signs in last 24 hours: Temp:  [99.4 F (37.4 C)-99.8 F (37.7 C)] 99.4 F (37.4 C) (10/18 0725) Pulse Rate:  [99-146] 126 (10/18 0740) Resp:  [11-28] 11 (10/18 0740) BP: (106-170)/(68-99) 170/68 (10/18 0740) SpO2:  [95 %-100 %] 100 % (10/18 0740) Weight:  [  103.1 kg-104.3 kg] 103.1 kg (10/17 2315)   General:   Alert,  Well-developed, well-nourished, pleasant and cooperative in NAD, frequent coughing throughout visit.  Head:  Normocephalic and atraumatic. Eyes:  Sclera clear, no icterus.   Conjunctiva pink. Ears:  Normal auditory acuity. Lungs: Scattered wheezes throughout, no crackles or rhonchi.  No acute distress.   Heart: Tachycardic with heart rate fluctuating between 113-122.  Regular rhythm. No murmurs, clicks, rubs,  or gallops. Abdomen:  Soft, nontender and nondistended. No masses, hepatosplenomegaly or hernias noted. Normal bowel sounds, without guarding, and without rebound.   Rectal:  Deferred  Msk:  Symmetrical without gross deformities. Normal posture. Extremities:  Without edema. Neurologic:  Alert and  oriented x4;  grossly normal neurologically. Skin:  Intact without significant lesions or rashes. Psych: Normal mood and affect.  Intake/Output from previous day: 10/17 0701 - 10/18 0700 In: 999 [IV Piggyback:999] Out: -  Intake/Output this shift: Total I/O In: 797.6 [I.V.:797.6] Out: -   Lab Results: Recent Labs     04/27/21 2316 04/28/21 1440 04/29/21 0540  WBC 20.3* 19.8* 14.8*  HGB 14.0 14.9 13.3  HCT 43.9 47.4* 42.3  PLT 318 321 284   BMET Recent Labs    04/27/21 2316 04/28/21 1440 04/29/21 0540  NA 135 133* 137  K 3.9 4.3 3.8  CL 101 100 105  CO2 25 23 26   GLUCOSE 154* 170* 112*  BUN 10 9 8   CREATININE 0.65 0.68 0.58  CALCIUM 9.4 9.2 8.5*   Studies/Results: DG Chest 2 View  Result Date: 04/27/2021 CLINICAL DATA:  Cough and history of prior pneumonia EXAM: CHEST - 2 VIEW COMPARISON:  04/19/2021 FINDINGS: The heart size and mediastinal contours are within normal limits. Both lungs are clear. The visualized skeletal structures are unremarkable. IMPRESSION: No active cardiopulmonary disease. Electronically Signed   By: 04/29/2021 M.D.   On: 04/27/2021 23:07   CT Angio Chest PE W/Cm &/Or Wo Cm  Result Date: 04/28/2021 CLINICAL DATA:  Chest pressure and shortness of breath for the past 2 weeks. EXAM: CT ANGIOGRAPHY CHEST WITH CONTRAST TECHNIQUE: Multidetector CT imaging of the chest was performed using the standard protocol during bolus administration of intravenous contrast. Multiplanar CT image reconstructions and MIPs were obtained to evaluate the vascular anatomy. CONTRAST:  72mL OMNIPAQUE IOHEXOL 350 MG/ML SOLN COMPARISON:  Chest x-ray from same day. FINDINGS: Cardiovascular: Satisfactory opacification of the pulmonary arteries to the segmental level. No evidence of pulmonary embolism. Normal heart size. No pericardial effusion. No thoracic aortic aneurysm or dissection. Mediastinum/Nodes: No enlarged mediastinal, hilar, or axillary lymph nodes. Thyroid gland, trachea, and esophagus demonstrate no significant findings. Lungs/Pleura: Minimal subsegmental atelectasis and/or scarring in the medial right middle lobe. No focal consolidation, pleural effusion, or pneumothorax. Upper Abdomen: No acute abnormality. Incompletely visualized 3.3 x 2.1 cm mass that appears to arise from the pancreatic  body (series 4, image 99). Musculoskeletal: No chest wall abnormality. No acute or significant osseous findings. Review of the MIP images confirms the above findings. IMPRESSION: 1. No evidence of pulmonary embolism. No acute intrathoracic process. 2. 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 Recommend further evaluation with outpatient pancreatic protocol CT or MRI with and without contrast. Electronically Signed   By: 04/30/2021 M.D.   On: 04/28/2021 17:31   DG Chest Port 1 View  Result Date: 04/28/2021 CLINICAL DATA:  Shortness of breath. EXAM: PORTABLE CHEST 1 VIEW COMPARISON:  Chest x-ray 04/27/2021. FINDINGS:  The heart size and mediastinal contours are within normal limits. Both lungs are clear. The visualized skeletal structures are unremarkable. IMPRESSION: No active disease. Electronically Signed   By: Darliss Cheney M.D.   On: 04/28/2021 16:31    Impression: 30 year old female with medical history significant for HSV who presented to the emergency room with complaints of persistent difficulty breathing, productive cough since October 8 with chest x-ray at that time showing mild pneumonia s/p treatment with doxycycline and prednisone.  In the ED, no acute laboratory abnormalities aside from elevated white count, possibly secondary to recent steroids, and no acute findings on chest x-ray or CTA chest.  She was found to have tachycardia with heart rate up to 150s-160s with increased respiratory rate, now admitted for dyspnea and tachycardia.  GI was consulted due to pancreatic lesion noted on chest CT.  Pancreatic lesion: Incompletely visualized 3.3 x 2.1 cm mass that appears to arise from pancreatic body on CTA chest recommendations for outpatient pancreatic protocol CT or MRI with and without contrast.  She has no significant GI history.  Notes change in bowel movements with 1-2 mushy bowel movements daily x2 months, sometimes incomplete,  otherwise denies abdominal pain, nausea, vomiting, scleral icterus, unintentional weight loss, brbpr, melena, or family history of pancreatic cancer.  No other abdominal imaging on to compare to.  Patient needs further characterization of this lesion. In light of ongoing shortness of breath, wheezing, frequent coughing, and no concerning GI symptoms, recommend MRI with and without contrast once over acute illness in efforts to obtain quality images.   Dyspnea/tachycardia:  Suspicion that persistent cough is secondary to recent history of pneumonia.  Dyspnea on exertion likely secondary to tachycardia.  Etiology of tachycardia unclear.  She received adenosine and IV Cardizem in ED.  Also received DuoNeb and has been started on Robitussin-DM.  Continues with frequent coughing, productive of clear/sputum, mild shortness of breath, wheezing on exam and tachycardia.  WBC down to 14.8 today from 20.3, 2 days ago. Sed rate mildly elevated at 32 today. CRP normal. Procalcitonin <0.10. Echocardiogram completed yesterday with results pending.  Management per hospitalist.   Plan: Outpatient MRI abdomen with and without contrast in 4-6 weeks.  Management of dyspnea and tachycardia per hospitalist. GI will sign off.    LOS: 0 days    04/29/2021, 8:35 AM   Ermalinda Memos, PA-C [facility]

## 2021-04-29 NOTE — Progress Notes (Signed)
  April 29, 2021  Patient: Charlene Key  Date of Birth: [birth year]  Date of Visit: 04/28/2021    To Whom It May Concern:  Charlene Key was seen and treated in our emergency department  and admitted to [facility] stepdown on 04/28/2021. Charlene Key significant other has given emotional support since.  Sincerely,  Demetrio Lapping RN [facility] ICU/stepdown.

## 2021-04-29 NOTE — Progress Notes (Signed)
  Echocardiogram 2D Echocardiogram has been performed.  Charlene Key 04/29/2021, 4:13 PM

## 2021-04-29 NOTE — Telephone Encounter (Signed)
[facility] clinical pool, patient needs CT abdomen with pancreatic protocol in 4-6 weeks.  Dx: Pancreatic lesion.  Please note, she is currently hospitalized.  Unclear when she will be discharged.

## 2021-04-29 NOTE — Progress Notes (Signed)
PROGRESS NOTE   Charlene Link  OVF:[id number] DOB: 1991/02/07 DOA: 04/28/2021 PCP: [facility]   Chief Complaint  Patient presents with   Shortness of Breath   Level of care: Stepdown  Brief Admission History:  Charlene Key is a 30 y.o. female with medical history significant for  HSV.  Patient presented to the ED with complaints of persistent difficulty breathing.  Reports symptoms have been present since 8 October.  Difficulty breathing is present mostly with exertion, at rest she is okay.  No chest pains.  No leg swelling.  She also reports a productive cough since October 8. He presented to an urgent care 10/8 chest x-ray showed mild pneumonia, for which she was prescribed doxycycline and prednisone. She had been using her albuterol inhaler without improvement.  While in ED, patient was  given albuterol and steroids for SOB. Patient became tachycardic after administration. Tachycardia likely secondary albuterol and steroid use.    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  Patient's tachycardia is likely precipitated by the use of steroid medication.  We will discontinue this medication.    No antiarrhythmic medication decision has been made at this time to see what effect medication change has on patient heart rate. Patient should still be continued to be monitored via telemetry. -sinus tachycardia on EKG.   Recent pneumonia -may explain her persistent productive cough.  Dyspnea on exertion likely related to her tachycardia.  Lung exam- very faint mild and sparse expiratory wheezing.  Chest X-ray 04/19/2021 showed faint right middle lobe opacities suggesting mild pneumonia.  She was treated with doxycycline.  CTA chest today negative for PE or other intra-thoracic process.   -DuoNebs as needed -Mucolytic's    Leukocytosis-19.8.No focus of  infection identified. ??  Secondary to steroids. -Trend  ?? 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 She is unaware of any family history of cancers, including abdominal cancers.   -Consulted gastroenterology for further management of patient's pancreatic mass. -She will follow up outpatient with GI in 4-6 weeks for MRI abdomen with and without contrast .     Concern for diabetes mellitus -Patient endorsing excessive thirst and urination.  Patient reports no history of diabetes.  Given recent use of steroids, some concern for steroid-induced diabetes mellitus.  Patient's blood glucoses have been elevated. -A1c ordered.   DVT prophylaxis: Lovenox Code Status: Full code Family Communication: Spouse at bedside Disposition Plan: 1-2 days Consults called: Gastroenterology Admission status: Obs stepdown ( due to severity of tachycardia, would not be accepted on regular floors).  Status is: Observation  The patient remains OBS appropriate and will d/c before 2 midnights.    Consultants:  Gastroenterology  Procedures:  N/A  Antimicrobials:  N/A  Subjective: Patient interviewed in her room.  Patient continues to endorse shortness of breath and fast heart rate.  Patient was recently treated in the ED for pneumonia; at that time she was prescribed doxycycline and prednisone.  She has been using albuterol inhaler with minimal improvement.   Patient states that she has been increasingly thirsty, despite drinking more frequently. She also states that she has been urinating more frequently. She denies any history of diabetes.    Objective: Vitals:   04/29/21 0455 04/29/21 0600 04/29/21 0725 04/29/21 0740  BP: (!) 156/88   Marland Kitchen)  170/68  Pulse:  (!) 105 (!) 123 (!) 126  Resp:  18 14 11   Temp:   99.4 F (37.4 C)   TempSrc:   Oral   SpO2:  100% 100% 100%  Weight:      Height:        Intake/Output Summary (Last 24  hours) at 04/29/2021 0926 Last data filed at 04/29/2021 0740 Gross per 24 hour  Intake 1796.63 ml  Output --  Net 1796.63 ml   Filed Weights   04/28/21 1404 04/28/21 2315  Weight: 104.3 kg 103.1 kg    Examination:  General exam: Obese woman laying in bed. Patient in no acute distress. Respiratory system: Faint expiratory wheeze present. Respiratory effort normal.  No accessory muscle use Cardiovascular system: Tachycardic normal rhythm S1 & S2 heard. No JVD, murmurs, rubs, gallops or clicks. No pedal edema. Gastrointestinal system: Abdomen is nondistended, soft and nontender. No organomegaly or masses felt. Normal bowel sounds heard. Central nervous system: Alert and oriented. No focal neurological deficits. Extremities: Symmetric 5 x 5 power. Skin: No rashes, lesions or ulcers Psychiatry: Judgement and insight appear normal. Mood & affect appropriate.   Data Reviewed: I have personally reviewed following labs and imaging studies  CBC: Recent Labs  Lab 04/27/21 2316 04/28/21 1440 04/29/21 0540  WBC 20.3* 19.8* 14.8*  NEUTROABS 15.9*  --   --   HGB 14.0 14.9 13.3  HCT 43.9 47.4* 42.3  MCV 87.8 86.5 87.4  PLT 318 321 284    Basic Metabolic Panel: Recent Labs  Lab 04/27/21 2316 04/28/21 1440 04/28/21 1720 04/29/21 0540  NA 135 133*  --  137  K 3.9 4.3  --  3.8  CL 101 100  --  105  CO2 25 23  --  26  GLUCOSE 154* 170*  --  112*  BUN 10 9  --  8  CREATININE 0.65 0.68  --  0.58  CALCIUM 9.4 9.2  --  8.5*  MG  --   --  1.9  --     GFR: Estimated Creatinine Clearance: 115.9 mL/min (by C-G formula based on SCr of 0.58 mg/dL).  Liver Function Tests: No results for input(s): AST, ALT, ALKPHOS, BILITOT, PROT, ALBUMIN in the last 168 hours.  CBG: No results for input(s): GLUCAP in the last 168 hours.  Recent Results (from the past 240 hour(s))  Resp Panel by RT-PCR (Flu A&B, Covid) Nasopharyngeal Swab     Status: None   Collection Time: 04/27/21 10:54 PM    Specimen: Nasopharyngeal Swab; Nasopharyngeal(NP) swabs in vial transport medium  Result Value Ref Range Status   SARS Coronavirus 2 by RT PCR NEGATIVE NEGATIVE Final    Comment: (NOTE) SARS-CoV-2 target nucleic acids are NOT DETECTED.  The SARS-CoV-2 RNA is generally detectable in upper respiratory specimens during the acute phase of infection. The lowest concentration of SARS-CoV-2 viral copies this assay can detect is 138 copies/mL. A negative result does not preclude SARS-Cov-2 infection and should not be used as the sole basis for treatment or other patient management decisions. A negative result may occur with  improper specimen collection/handling, submission of specimen other than nasopharyngeal swab, presence of viral mutation(s) within the areas targeted by this assay, and inadequate number of viral copies(<138 copies/mL). A negative result must be combined with clinical observations, patient history, and epidemiological information. The expected result is Negative.  Fact Sheet for Patients:  04/29/21  Fact Sheet for Healthcare Providers:  BloggerCourse.com  This test is no  t yet approved or cleared by the [place] and  has been authorized for detection and/or diagnosis of SARS-CoV-2 by FDA under an Emergency Use Authorization (EUA). This EUA will remain  in effect (meaning this test can be used) for the duration of the COVID-19 declaration under Section 564(b)(1) of the Act, 21 U.S.C.section 360bbb-3(b)(1), unless the authorization is terminated  or revoked sooner.       Influenza A by PCR NEGATIVE NEGATIVE Final   Influenza B by PCR NEGATIVE NEGATIVE Final    Comment: (NOTE) The Xpert Xpress SARS-CoV-2/FLU/RSV plus assay is intended as an aid in the diagnosis of influenza from Nasopharyngeal swab specimens and should not be used as a sole basis for treatment. Nasal washings and aspirates are  unacceptable for Xpert Xpress SARS-CoV-2/FLU/RSV testing.  Fact Sheet for Patients: BloggerCourse.com  Fact Sheet for Healthcare Providers: SeriousBroker.it  This test is not yet approved or cleared by the [place] FDA and has been authorized for detection and/or diagnosis of SARS-CoV-2 by FDA under an Emergency Use Authorization (EUA). This EUA will remain in effect (meaning this test can be used) for the duration of the COVID-19 declaration under Section 564(b)(1) of the Act, 21 U.S.C. section 360bbb-3(b)(1), unless the authorization is terminated or revoked.  Performed at [facility], [address], [place], [place] [address]   Blood culture (routine x 2)     Status: None (Preliminary result)   Collection Time: 04/28/21  4:59 PM   Specimen: BLOOD RIGHT HAND  Result Value Ref Range Status   Specimen Description BLOOD RIGHT HAND  Final   Special Requests   Final    BOTTLES DRAWN AEROBIC ONLY Blood Culture results may not be optimal due to an inadequate volume of blood received in culture bottles   Culture   Final    NO GROWTH < 24 HOURS Performed at [facility], [address], [place], [place] [address]    Report Status PENDING  Incomplete  Blood culture (routine x 2)     Status: None (Preliminary result)   Collection Time: 04/28/21  4:59 PM   Specimen: BLOOD LEFT HAND  Result Value Ref Range Status   Specimen Description BLOOD LEFT HAND  Final   Special Requests   Final    BOTTLES DRAWN AEROBIC ONLY Blood Culture results may not be optimal due to an inadequate volume of blood received in culture bottles   Culture   Final    NO GROWTH < 24 HOURS Performed at [facility], [address], [place], [place] [address]    Report Status PENDING  Incomplete     Radiology Studies: DG Chest 2 View  Result Date: 04/27/2021 CLINICAL DATA:  Cough and history of prior pneumonia EXAM: CHEST - 2 VIEW COMPARISON:  04/19/2021  FINDINGS: The heart size and mediastinal contours are within normal limits. Both lungs are clear. The visualized skeletal structures are unremarkable. IMPRESSION: No active cardiopulmonary disease. Electronically Signed   By: Alcide Clever M.D.   On: 04/27/2021 23:07   CT Angio Chest PE W/Cm &/Or Wo Cm  Result Date: 04/28/2021 CLINICAL DATA:  Chest pressure and shortness of breath for the past 2 weeks. EXAM: CT ANGIOGRAPHY CHEST WITH CONTRAST TECHNIQUE: Multidetector CT imaging of the chest was performed using the standard protocol during bolus administration of intravenous contrast. Multiplanar CT image reconstructions and MIPs were obtained to evaluate the vascular anatomy. CONTRAST:  43mL OMNIPAQUE IOHEXOL 350 MG/ML SOLN COMPARISON:  Chest x-ray from same day.  FINDINGS: Cardiovascular: Satisfactory opacification of the pulmonary arteries to the segmental level. No evidence of pulmonary embolism. Normal heart size. No pericardial effusion. No thoracic aortic aneurysm or dissection. Mediastinum/Nodes: No enlarged mediastinal, hilar, or axillary lymph nodes. Thyroid gland, trachea, and esophagus demonstrate no significant findings. Lungs/Pleura: Minimal subsegmental atelectasis and/or scarring in the medial right middle lobe. No focal consolidation, pleural effusion, or pneumothorax. Upper Abdomen: No acute abnormality. 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 Musculoskeletal: No chest wall abnormality. No acute or significant osseous findings. Review of the MIP images confirms the above findings. IMPRESSION: 1. No evidence of pulmonary embolism. No acute intrathoracic process. 2. 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 Recommend further evaluation with outpatient pancreatic protocol CT or MRI with and without contrast. Electronically Signed   By: Obie Dredge M.D.   On: 04/28/2021 17:31    DG Chest Port 1 View  Result Date: 04/28/2021 CLINICAL DATA:  Shortness of breath. EXAM: PORTABLE CHEST 1 VIEW COMPARISON:  Chest x-ray 04/27/2021. FINDINGS: The heart size and mediastinal contours are within normal limits. Both lungs are clear. The visualized skeletal structures are unremarkable. IMPRESSION: No active disease. Electronically Signed   By: Darliss Cheney M.D.   On: 04/28/2021 16:31    Scheduled Meds:  Chlorhexidine Gluconate Cloth  6 each Topical Daily   enoxaparin (LOVENOX) injection  40 mg Subcutaneous Q24H   guaiFENesin-dextromethorphan  10 mL Oral Q6H   Continuous Infusions:  sodium chloride 100 mL/hr at 04/28/21 2341     LOS: 0 days   Time spent: 35  Wendee Beavers Annarae Macnair MS4   Standley Dakins, MD How to contact the [facility] Attending or Consulting provider 7A - 7P or covering provider during after hours 7P -7A, for this patient?  Check the care team in [facility] and look for a) attending/consulting [facility] provider listed and b) the [facility] team listed Log into [email or web address] and use [place]'s universal password to access. If you do not have the password, please contact the hospital operator. Locate the [facility] provider you are looking for under [facility] and page to a number that you can be directly reached. If you still have difficulty reaching the provider, please page the [facility] (Director on Call) for the Hospitalists listed on amion for assistance.  04/29/2021, 9:26 AM

## 2021-04-29 NOTE — Plan of Care (Signed)
  Problem: Education: Goal: Knowledge of General Education information will improve Description Including pain rating scale, medication(s)/side effects and non-pharmacologic comfort measures Outcome: Progressing   

## 2021-04-30 ENCOUNTER — Other Ambulatory Visit (INDEPENDENT_AMBULATORY_CARE_PROVIDER_SITE_OTHER): Payer: Self-pay

## 2021-04-30 DIAGNOSIS — K8689 Other specified diseases of pancreas: Secondary | ICD-10-CM

## 2021-04-30 LAB — BLOOD CULTURE ID PANEL (REFLEXED) - BCID2

## 2021-04-30 LAB — BASIC METABOLIC PANEL
Anion gap: 8 (ref 5–15)
BUN: 9 mg/dL (ref 6–20)
CO2: 28 mmol/L (ref 22–32)
Calcium: 8.5 mg/dL — ABNORMAL LOW (ref 8.9–10.3)
Chloride: 101 mmol/L (ref 98–111)
Creatinine, Ser: 0.7 mg/dL (ref 0.44–1.00)
GFR, Estimated: >60 mL/min (ref >60)
Glucose, Bld: 111 mg/dL — ABNORMAL HIGH (ref 70–99)
Potassium: 3.9 mmol/L (ref 3.5–5.1)
Sodium: 137 mmol/L (ref 135–145)

## 2021-04-30 LAB — MAGNESIUM: Magnesium: 2.1 mg/dL (ref 1.7–2.4)

## 2021-04-30 LAB — PROCALCITONIN: Procalcitonin: <0.1 ng/mL

## 2021-04-30 MED ORDER — GUAIFENESIN-DM 100-10 MG/5ML PO SYRP: 5.0000 mL | ORAL_SOLUTION | ORAL | 0 refills | Status: DC | PRN

## 2021-04-30 MED ORDER — VITAMIN D (ERGOCALCIFEROL) 1.25 MG (50000 UNIT) PO CAPS: 50000.0000 [IU] | ORAL_CAPSULE | ORAL | 0 refills | Status: DC

## 2021-04-30 MED ORDER — ALBUTEROL SULFATE HFA 108 (90 BASE) MCG/ACT IN AERS: 1.0000 | INHALATION_SPRAY | Freq: Four times a day (QID) | RESPIRATORY_TRACT | 3 refills | Status: DC | PRN

## 2021-04-30 NOTE — Plan of Care (Signed)

## 2021-04-30 NOTE — Progress Notes (Signed)
Patient alert and oriented x4. No complaints of pain, shortness of breath, chest pain, dizziness, nausea or vomiting. Patient up out bed, ambulatory in room independently with steady gait. Patient tolerated PO meds and diet well. Appetite good. Discharge summary/instructions, follow up appointments information and medication education gone over with patient. All questions answered and patient expressed full understanding of instructions/summary, follow up info and education. IV removed with NO complications. Patient discharged with all belongings and work note for home via car.

## 2021-04-30 NOTE — Discharge Summary (Signed)
Physician Discharge Summary  Charlene Key ZOX:[id number] DOB: 06-Sep-1990 DOA: 04/28/2021  PCP: [facility]  Admit date: 04/28/2021 Discharge date: 04/30/2021  Admitted From: Emergency department Disposition: Patient's home  Recommendations for Outpatient Follow-up:  Follow up with PCP in 1 weeks Please obtain BMP/CBC in one week Follow-up with gastroenterology in 4 to 6 weeks to discuss pancreatic lesion found on CT. Continue on inhalers as needed for shortness of breath or wheezing, avoid further steroid use Continue vitamin D supplementation  Home Health:     Discharge Condition: FAir  CODE STATUS: Full   Brief Hospitalization Summary:  Initially, patient presented to the ED with complaints of persistent difficulty breathing.  She reported that her symptoms have been present since 8 October. She had difficulty breathing mostly with exertion, at rest she is okay.  Patient also states that she has had a productive cough since October 8, when she was diagnosed with pneumonia.  He had been prescribed doxycycline and prednisone. no chest pains.  No leg swelling.  Prior to admission, patient states that she took 60 mg and of 50 mg tablet of prednisone on 1017 and since that time she has been having palpitations, tachycardia, and increased anxiety.  While in the ED, patient was treated for presumed SVT and received 2 doses of adenosine but she was an sinus tachycardia.  She was also given 20 mg Cardizem and 1 L bolus given. CT chest did not show evidence of pneumonia or PE.  Patient was monitored throughout admission.  Monitoring of the patient showed multiple elevated blood pressures and it is currently unclear if this is true hypertension or a side effect of the high dose prednisone.  With her persistently elevated heart rates started low dose metoprolol for BP and HR control 25 mg BID.  Echocardiogram with patient heart was grossly normal.   During patient admission, a suspicious  pancreatic lesion was detected on CT incidentally.  GI was consulted.  Per GI note patient was instructed to follow-up in 4 to 6 weeks.  During admission patient also endorsed excessive throat thirst and urination.  Hemoglobin A1c was checked: A1c of 5.8.  Patient's hemoglobin A1c indicates that patient is currently in the prediabetic range.  Patient was also found to be vitamin D deficient.  Patient given Drisdol 50K IU caps once per week, first dose given 10/18.  Please see all hospital notes, images, labs for full details of the hospitalization.  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  Discharge Instructions: Discharge Instructions     Diet - low sodium heart healthy   Complete by: As directed    Increase activity slowly   Complete by: As directed       Allergies as of 04/30/2021   No Known Allergies      Medication List     STOP taking these medications    doxycycline 100 MG capsule Commonly known as: VIBRAMYCIN   predniSONE 50 MG tablet Commonly known as: DELTASONE       TAKE these medications    acetaminophen 325 MG tablet Commonly known as: TYLENOL Take 650 mg by mouth every 6 (six) hours as needed.   albuterol 108 (90 Base) MCG/ACT inhaler Commonly known as: VENTOLIN HFA Inhale 1-2 puffs into the lungs every 6 (  six) hours as needed for wheezing or shortness of breath.   ferrous sulfate 324 MG Tbec Take by mouth.   guaiFENesin-dextromethorphan 100-10 MG/5ML syrup Commonly known as: ROBITUSSIN DM Take 5 mLs by mouth every 4 (four) hours as needed for cough.   PNV-DHA 27-0.6-0.4-300 MG Caps Take 1 capsule by mouth daily.   Vitamin D (Ergocalciferol) 1.25 MG (50000 UNIT) Caps capsule Commonly known as: DRISDOL Take  1 capsule (50,000 Units total) by mouth every 7 (seven) days. Start taking on: May 06, 2021        Follow-up Information     [facility]. Schedule an appointment as soon as possible for a visit in 1 week(s).   Contact information: [facility] [address] [place] [address] [phone]                No Known Allergies Allergies as of 04/30/2021   No Known Allergies      Medication List     STOP taking these medications    doxycycline 100 MG capsule Commonly known as: VIBRAMYCIN   predniSONE 50 MG tablet Commonly known as: DELTASONE       TAKE these medications    acetaminophen 325 MG tablet Commonly known as: TYLENOL Take 650 mg by mouth every 6 (six) hours as needed.   albuterol 108 (90 Base) MCG/ACT inhaler Commonly known as: VENTOLIN HFA Inhale 1-2 puffs into the lungs every 6 (six) hours as needed for wheezing or shortness of breath.   ferrous sulfate 324 MG Tbec Take by mouth.   guaiFENesin-dextromethorphan 100-10 MG/5ML syrup Commonly known as: ROBITUSSIN DM Take 5 mLs by mouth every 4 (four) hours as needed for cough.   PNV-DHA 27-0.6-0.4-300 MG Caps Take 1 capsule by mouth daily.   Vitamin D (Ergocalciferol) 1.25 MG (50000 UNIT) Caps capsule Commonly known as: DRISDOL Take 1 capsule (50,000 Units total) by mouth every 7 (seven) days. Start taking on: May 06, 2021        Procedures/Studies: DG Chest 2 View  Result Date: 04/27/2021 CLINICAL DATA:  Cough and history of prior pneumonia EXAM: CHEST - 2 VIEW COMPARISON:  04/19/2021 FINDINGS: The heart size and mediastinal contours are within normal limits. Both lungs are clear. The visualized skeletal structures are unremarkable. IMPRESSION: No active cardiopulmonary disease. Electronically Signed   By: Alcide Clever M.D.   On: 04/27/2021 23:07   DG Chest 2 View  Result Date: 04/19/2021 CLINICAL DATA:  Cough and dyspnea EXAM: CHEST - 2  VIEW COMPARISON:  09/25/2017 chest radiograph. FINDINGS: Stable cardiomediastinal silhouette with normal heart size. No pneumothorax. No pleural effusion. Faint patchy right middle lobe opacity, new. Otherwise clear lungs. IMPRESSION: Faint patchy right middle lobe opacity, new, suggesting mild pneumonia. Chest radiograph follow-up suggested after treatment. Electronically Signed   By: Delbert Phenix M.D.   On: 04/19/2021 09:34   CT Angio Chest PE W/Cm &/Or Wo Cm  Result Date: 04/28/2021 CLINICAL DATA:  Chest pressure and shortness of breath for the past 2 weeks. EXAM: CT ANGIOGRAPHY CHEST WITH CONTRAST TECHNIQUE: Multidetector CT imaging of the chest was performed using the standard protocol during bolus administration of intravenous contrast. Multiplanar CT image reconstructions and MIPs were obtained to evaluate the vascular anatomy. CONTRAST:  75mL OMNIPAQUE IOHEXOL 350 MG/ML SOLN COMPARISON:  Chest x-ray from same day. FINDINGS: Cardiovascular: Satisfactory opacification of the pulmonary arteries to the segmental level. No evidence of pulmonary embolism. Normal heart size. No pericardial effusion.  No thoracic aortic aneurysm or dissection. Mediastinum/Nodes: No enlarged mediastinal, hilar, or axillary lymph nodes. Thyroid gland, trachea, and esophagus demonstrate no significant findings. Lungs/Pleura: Minimal subsegmental atelectasis and/or scarring in the medial right middle lobe. No focal consolidation, pleural effusion, or pneumothorax. Upper Abdomen: No acute abnormality. 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 Musculoskeletal: No chest wall abnormality. No acute or significant osseous findings. Review of the MIP images confirms the above findings. IMPRESSION: 1. No evidence of pulmonary embolism. No acute intrathoracic process. 2. 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 Recommend  further evaluation with outpatient pancreatic protocol CT or MRI with and without contrast. Electronically Signed   By: Obie Dredge M.D.   On: 04/28/2021 17:31   DG Chest Port 1 View  Result Date: 04/28/2021 CLINICAL DATA:  Shortness of breath. EXAM: PORTABLE CHEST 1 VIEW COMPARISON:  Chest x-ray 04/27/2021. FINDINGS: The heart size and mediastinal contours are within normal limits. Both lungs are clear. The visualized skeletal structures are unremarkable. IMPRESSION: No active disease. Electronically Signed   By: Darliss Cheney M.D.   On: 04/28/2021 16:31   ECHOCARDIOGRAM COMPLETE  Result Date: 04/29/2021    ECHOCARDIOGRAM REPORT   Patient Name:   Charlene Key Date of Exam: 04/29/2021 Medical Rec #:  [id number]  Height:       63.0 in Accession #:    [id number] Weight:       227.3 lb Date of Birth:  1990-09-07  BSA:          2.042 m Patient Age:    30 years   BP:           133/67 mmHg Patient Gender: F          HR:           116 bpm. [facility] Procedure: 2D Echo, Cardiac Doppler and Color Doppler Indications:    Tachycardia  History:        Patient has no prior history of Echocardiogram examinations.                 Signs/Symptoms:Dyspnea; Risk Factors:Obesity. Pneumonia.  Sonographer:    Lavenia Atlas RDCS Referring Phys: [phone] Heloise Beecham [facility] IMPRESSIONS  1. Left ventricular ejection fraction, by estimation, is 65 to 70%. The left ventricle has normal function. The left ventricle has no regional wall motion abnormalities. Left ventricular diastolic parameters were normal.  2. Right ventricular systolic function is normal. The right ventricular size is normal. Tricuspid regurgitation signal is inadequate for assessing PA pressure.  3. The mitral valve is grossly normal. No evidence of mitral valve regurgitation.  4. The aortic valve was not well visualized. Aortic valve regurgitation is not visualized.  5. The inferior vena cava is normal in size with greater than 50% respiratory  variability, suggesting right atrial pressure of 3 mmHg. Comparison(s): No prior Echocardiogram. FINDINGS  Left Ventricle: Left ventricular ejection fraction, by estimation, is 65 to 70%. The left ventricle has normal function. The left ventricle has no regional wall motion abnormalities. The left ventricular internal cavity size was normal in size. There is  borderline left ventricular hypertrophy. Left ventricular diastolic parameters were normal. Right Ventricle: The right ventricular size is normal. No increase in right ventricular wall thickness. Right ventricular systolic function is normal. Tricuspid regurgitation signal is inadequate for assessing PA pressure. Left Atrium: Left atrial size was  normal in size. Right Atrium: Right atrial size was normal in size. Pericardium: There is no evidence of pericardial effusion. Mitral Valve: The mitral valve is grossly normal. No evidence of mitral valve regurgitation. Tricuspid Valve: The tricuspid valve is grossly normal. Tricuspid valve regurgitation is trivial. Aortic Valve: The aortic valve was not well visualized. There is mild aortic valve annular calcification. Aortic valve regurgitation is not visualized. Pulmonic Valve: The pulmonic valve was grossly normal. Pulmonic valve regurgitation is trivial. Aorta: The aortic root is normal in size and structure. Venous: The inferior vena cava is normal in size with greater than 50% respiratory variability, suggesting right atrial pressure of 3 mmHg. IAS/Shunts: No atrial level shunt detected by color flow Doppler.  LEFT VENTRICLE PLAX 2D LVIDd:         4.00 cm   Diastology LVIDs:         2.60 cm   LV e' medial:    15.20 cm/s LV PW:         1.10 cm   LV E/e' medial:  6.4 LV IVS:        1.00 cm   LV e' lateral:   12.30 cm/s LVOT diam:     2.00 cm   LV E/e' lateral: 7.9 LV SV:         63 LV SV Index:   31 LVOT Area:     3.14 cm  RIGHT VENTRICLE RV Basal diam:  2.70 cm RV S prime:     28.30 cm/s TAPSE (M-mode): 2.4 cm  LEFT ATRIUM             Index        RIGHT ATRIUM           Index LA diam:        2.40 cm 1.18 cm/m   RA Area:     12.80 cm LA Vol (A2C):   28.9 ml 14.15 ml/m  RA Volume:   31.20 ml  15.28 ml/m LA Vol (A4C):   40.9 ml 20.03 ml/m LA Biplane Vol: 35.0 ml 17.14 ml/m  AORTIC VALVE LVOT Vmax:   129.00 cm/s LVOT Vmean:  77.900 cm/s LVOT VTI:    0.201 m  AORTA Ao Root diam: 2.40 cm MITRAL VALVE MV Area (PHT): 6.65 cm     SHUNTS MV Decel Time: 114 msec     Systemic VTI:  0.20 m MV E velocity: 96.70 cm/s   Systemic Diam: 2.00 cm MV A velocity: 111.00 cm/s MV E/A ratio:  0.87 Nona Dell MD Electronically signed by Nona Dell MD Signature Date/Time: 04/29/2021/8:48:20 PM    Final      Subjective: Patient interviewed in her room this morning.  Patient states that she feels a lot better.  She states that her heart has not been beating as fast.  Discussed with patient the role of the prednisone and patient's current presentation.  Also discussed the results of patient's recent hemoglobin A1c.  A1c of 5.8, placing the patient in the prediabetic range.  Advise follow-up with primary care provider.  Patient continues to endorse obese and shortness of breath with exertion.  She currently denies chest pains, palpitations, dizziness or confusion.   Discharge Exam: Vitals:   04/30/21 0800 04/30/21 0934  BP:  (!) 152/87  Pulse:  99  Resp:    Temp:    SpO2: 100%    Vitals:   04/30/21 0600 04/30/21 0700 04/30/21 0800 04/30/21 0934  BP: (!) 159/97 (!) 141/87  Marland Kitchen)  152/87  Pulse: 88 83  99  Resp: 18 14    Temp:      TempSrc:      SpO2: 100% 100% 100%   Weight:      Height:        General: Pt is alert, awake, not in acute distress, obese Cardiovascular: RRR, S1/S2 +, no rubs, no gallops Respiratory: Faint expiratory wheeze present. Respiratory effort normal.  No accessory muscle use. Abdominal: Soft, NT, ND, bowel sounds + Extremities: no edema, no cyanosis   The results of significant  diagnostics from this hospitalization (including imaging, microbiology, ancillary and laboratory) are listed below for reference.     Microbiology: Recent Results (from the past 240 hour(s))  Resp Panel by RT-PCR (Flu A&B, Covid) Nasopharyngeal Swab     Status: None   Collection Time: 04/27/21 10:54 PM   Specimen: Nasopharyngeal Swab; Nasopharyngeal(NP) swabs in vial transport medium  Result Value Ref Range Status   SARS Coronavirus 2 by RT PCR NEGATIVE NEGATIVE Final    Comment: (NOTE) SARS-CoV-2 target nucleic acids are NOT DETECTED.  The SARS-CoV-2 RNA is generally detectable in upper respiratory specimens during the acute phase of infection. The lowest concentration of SARS-CoV-2 viral copies this assay can detect is 138 copies/mL. A negative result does not preclude SARS-Cov-2 infection and should not be used as the sole basis for treatment or other patient management decisions. A negative result may occur with  improper specimen collection/handling, submission of specimen other than nasopharyngeal swab, presence of viral mutation(s) within the areas targeted by this assay, and inadequate number of viral copies(<138 copies/mL). A negative result must be combined with clinical observations, patient history, and epidemiological information. The expected result is Negative.  Fact Sheet for Patients:  BloggerCourse.com  Fact Sheet for Healthcare Providers:  SeriousBroker.it  This test is no t yet approved or cleared by the [place] FDA and  has been authorized for detection and/or diagnosis of SARS-CoV-2 by FDA under an Emergency Use Authorization (EUA). This EUA will remain  in effect (meaning this test can be used) for the duration of the COVID-19 declaration under Section 564(b)(1) of the Act, 21 U.S.C.section 360bbb-3(b)(1), unless the authorization is terminated  or revoked sooner.       Influenza A by PCR NEGATIVE  NEGATIVE Final   Influenza B by PCR NEGATIVE NEGATIVE Final    Comment: (NOTE) The Xpert Xpress SARS-CoV-2/FLU/RSV plus assay is intended as an aid in the diagnosis of influenza from Nasopharyngeal swab specimens and should not be used as a sole basis for treatment. Nasal washings and aspirates are unacceptable for Xpert Xpress SARS-CoV-2/FLU/RSV testing.  Fact Sheet for Patients: BloggerCourse.com  Fact Sheet for Healthcare Providers: SeriousBroker.it  This test is not yet approved or cleared by the [place] FDA and has been authorized for detection and/or diagnosis of SARS-CoV-2 by FDA under an Emergency Use Authorization (EUA). This EUA will remain in effect (meaning this test can be used) for the duration of the COVID-19 declaration under Section 564(b)(1) of the Act, 21 U.S.C. section 360bbb-3(b)(1), unless the authorization is terminated or revoked.  Performed at [facility], [address] [place], [place] [address]   Blood culture (routine x 2)     Status: None (Preliminary result)   Collection Time: 04/28/21  4:59 PM   Specimen: BLOOD RIGHT HAND  Result Value Ref Range Status   Specimen Description   Final    BLOOD RIGHT HAND Performed at [facility], [address], [place], [place] [address]    Special Requests   Final    BOTTLES DRAWN AEROBIC ONLY Blood Culture results may not be optimal due to an inadequate volume of blood received in culture bottles Performed at [facility], [address], [place], [place] [address]    Culture  Setup Time   Final    GRAM POSITIVE COCCI AEROBIC BOTTLE Gram Stain Report Called to,Read Back By and Verified With: [email or web address]  BY MATTHEWS, B 10.19.22 GRAM POSITIVE RODS CRITICAL RESULT CALLED TO, READ BACK BY AND VERIFIED WITH: PHARMD S.HALL AT 1000 ON 04/30/2021 BY T.SAAD. Performed at [facility], [address] [place], [place] [address]    Culture GRAM POSITIVE  COCCI GRAM POSITIVE RODS   Final   Report Status PENDING  Incomplete  Blood culture (routine x 2)     Status: None (Preliminary result)   Collection Time: 04/28/21  4:59 PM   Specimen: BLOOD LEFT HAND  Result Value Ref Range Status   Specimen Description BLOOD LEFT HAND  Final   Special Requests   Final    BOTTLES DRAWN AEROBIC ONLY Blood Culture results may not be optimal due to an inadequate volume of blood received in culture bottles   Culture   Final    NO GROWTH 2 DAYS Performed at [facility], [address] [place], [place] [address]    Report Status PENDING  Incomplete  Blood Culture ID Panel (Reflexed)     Status: Abnormal   Collection Time: 04/28/21  4:59 PM  Result Value Ref Range Status   Enterococcus faecalis NOT DETECTED NOT DETECTED Final   Enterococcus Faecium NOT DETECTED NOT DETECTED Final   Listeria monocytogenes NOT DETECTED NOT DETECTED Final   Staphylococcus species DETECTED (A) NOT DETECTED Final    Comment: CRITICAL RESULT CALLED TO, READ BACK BY AND VERIFIED WITH: PHARMD S.HALL AT 1000 ON 04/30/2021 BY T.SAAD.    Staphylococcus aureus (BCID) NOT DETECTED NOT DETECTED Final   Staphylococcus epidermidis DETECTED (A) NOT DETECTED Final    Comment: CRITICAL RESULT CALLED TO, READ BACK BY AND VERIFIED WITH: PHARMD S.HALL AT 1000 ON 04/30/2021 BY T.SAAD.    Staphylococcus lugdunensis NOT DETECTED NOT DETECTED Final   Streptococcus species NOT DETECTED NOT DETECTED Final   Streptococcus agalactiae NOT DETECTED NOT DETECTED Final   Streptococcus pneumoniae NOT DETECTED NOT DETECTED Final   Streptococcus pyogenes NOT DETECTED NOT DETECTED Final   A.calcoaceticus-baumannii NOT DETECTED NOT DETECTED Final   Bacteroides fragilis NOT DETECTED NOT DETECTED Final   Enterobacterales NOT DETECTED NOT DETECTED Final   Enterobacter cloacae complex NOT DETECTED NOT DETECTED Final   Escherichia coli NOT DETECTED NOT DETECTED Final   Klebsiella aerogenes NOT DETECTED NOT  DETECTED Final   Klebsiella oxytoca NOT DETECTED NOT DETECTED Final   Klebsiella pneumoniae NOT DETECTED NOT DETECTED Final   Proteus species NOT DETECTED NOT DETECTED Final   Salmonella species NOT DETECTED NOT DETECTED Final   Serratia marcescens NOT DETECTED NOT DETECTED Final   Haemophilus influenzae NOT DETECTED NOT DETECTED Final   Neisseria meningitidis NOT DETECTED NOT DETECTED Final   Pseudomonas aeruginosa NOT DETECTED NOT DETECTED Final   Stenotrophomonas maltophilia NOT DETECTED NOT DETECTED Final   Candida albicans NOT DETECTED NOT DETECTED Final   Candida auris NOT DETECTED NOT DETECTED Final   Candida glabrata NOT DETECTED NOT DETECTED Final   Candida krusei NOT DETECTED NOT DETECTED Final   Candida parapsilosis NOT DETECTED NOT DETECTED Final   Candida tropicalis NOT  DETECTED NOT DETECTED Final   Cryptococcus neoformans/gattii NOT DETECTED NOT DETECTED Final   Methicillin resistance mecA/C NOT DETECTED NOT DETECTED Final    Comment: Performed at [facility], [address], [place], [place] [address]  MRSA Next Gen by PCR, Nasal     Status: None   Collection Time: 04/28/21 11:30 PM   Specimen: Nasal Mucosa; Nasal Swab  Result Value Ref Range Status   MRSA by PCR Next Gen NOT DETECTED NOT DETECTED Final    Comment: (NOTE) The GeneXpert MRSA Assay (FDA approved for NASAL specimens only), is one component of a comprehensive MRSA colonization surveillance program. It is not intended to diagnose MRSA infection nor to guide or monitor treatment for MRSA infections. Test performance is not FDA approved in patients less than 72 years old. Performed at [facility], [address], [place], [place] [address]   Culture, blood (routine x 2)     Status: None (Preliminary result)   Collection Time: 04/30/21  5:11 AM   Specimen: BLOOD LEFT ARM  Result Value Ref Range Status   Specimen Description BLOOD LEFT ARM  Final   Special Requests   Final    Blood Culture adequate  volume BOTTLES DRAWN AEROBIC AND ANAEROBIC   Culture   Final    NO GROWTH < 12 HOURS Performed at [facility], [address] [place], [place] [address]    Report Status PENDING  Incomplete  Culture, blood (routine x 2)     Status: None (Preliminary result)   Collection Time: 04/30/21  5:12 AM   Specimen: BLOOD RIGHT ARM  Result Value Ref Range Status   Specimen Description BLOOD RIGHT ARM  Final   Special Requests   Final    Blood Culture adequate volume BOTTLES DRAWN AEROBIC ONLY   Culture   Final    NO GROWTH < 12 HOURS Performed at [facility], [address] [place], [place] [address]    Report Status PENDING  Incomplete     Labs: BNP (last 3 results) Recent Labs    04/28/21 1720  BNP 45.0   Basic Metabolic Panel: Recent Labs  Lab 04/27/21 2316 04/28/21 1440 04/28/21 1720 04/29/21 0540 04/30/21 0513  NA 135 133*  --  137 137  K 3.9 4.3  --  3.8 3.9  CL 101 100  --  105 101  CO2 25 23  --  26 28  GLUCOSE 154* 170*  --  112* 111*  BUN 10 9  --  8 9  CREATININE 0.65 0.68  --  0.58 0.70  CALCIUM 9.4 9.2  --  8.5* 8.5*  MG  --   --  1.9  --  2.1   Liver Function Tests: No results for input(s): AST, ALT, ALKPHOS, BILITOT, PROT, ALBUMIN in the last 168 hours. No results for input(s): LIPASE, AMYLASE in the last 168 hours. No results for input(s): AMMONIA in the last 168 hours. CBC: Recent Labs  Lab 04/27/21 2316 04/28/21 1440 04/29/21 0540  WBC 20.3* 19.8* 14.8*  NEUTROABS 15.9*  --   --   HGB 14.0 14.9 13.3  HCT 43.9 47.4* 42.3  MCV 87.8 86.5 87.4  PLT 318 321 284   Cardiac Enzymes: No results for input(s): CKTOTAL, CKMB, CKMBINDEX, TROPONINI in the last 168 hours. BNP: Invalid input(s): POCBNP CBG: No results for input(s): GLUCAP in the last 168 hours. D-Dimer Recent Labs    04/27/21 2316  DDIMER 0.39   Hgb A1c Recent Labs  04/29/21 1723  HGBA1C 5.8*   Lipid Profile No results for input(s): CHOL, HDL, LDLCALC, TRIG, CHOLHDL,  LDLDIRECT in the last 72 hours. Thyroid function studies Recent Labs    04/28/21 1720  TSH 0.466   Anemia work up No results for input(s): VITAMINB12, FOLATE, FERRITIN, TIBC, IRON, RETICCTPCT in the last 72 hours. Urinalysis    Component Value Date/Time   COLORURINE YELLOW 04/29/2021 0830   APPEARANCEUR CLEAR 04/29/2021 0830   LABSPEC 1.010 04/29/2021 0830   PHURINE 6.5 04/29/2021 0830   GLUCOSEU NEGATIVE 04/29/2021 0830   HGBUR TRACE (A) 04/29/2021 0830   BILIRUBINUR NEGATIVE 04/29/2021 0830   KETONESUR NEGATIVE 04/29/2021 0830   PROTEINUR NEGATIVE 04/29/2021 0830   NITRITE NEGATIVE 04/29/2021 0830   LEUKOCYTESUR TRACE (A) 04/29/2021 0830   Sepsis Labs Invalid input(s): PROCALCITONIN,  WBC,  LACTICIDVEN Microbiology Recent Results (from the past 240 hour(s))  Resp Panel by RT-PCR (Flu A&B, Covid) Nasopharyngeal Swab     Status: None   Collection Time: 04/27/21 10:54 PM   Specimen: Nasopharyngeal Swab; Nasopharyngeal(NP) swabs in vial transport medium  Result Value Ref Range Status   SARS Coronavirus 2 by RT PCR NEGATIVE NEGATIVE Final    Comment: (NOTE) SARS-CoV-2 target nucleic acids are NOT DETECTED.  The SARS-CoV-2 RNA is generally detectable in upper respiratory specimens during the acute phase of infection. The lowest concentration of SARS-CoV-2 viral copies this assay can detect is 138 copies/mL. A negative result does not preclude SARS-Cov-2 infection and should not be used as the sole basis for treatment or other patient management decisions. A negative result may occur with  improper specimen collection/handling, submission of specimen other than nasopharyngeal swab, presence of viral mutation(s) within the areas targeted by this assay, and inadequate number of viral copies(<138 copies/mL). A negative result must be combined with clinical observations, patient history, and epidemiological information. The expected result is Negative.  Fact Sheet for  Patients:  BloggerCourse.com  Fact Sheet for Healthcare Providers:  SeriousBroker.it  This test is no t yet approved or cleared by the [place] FDA and  has been authorized for detection and/or diagnosis of SARS-CoV-2 by FDA under an Emergency Use Authorization (EUA). This EUA will remain  in effect (meaning this test can be used) for the duration of the COVID-19 declaration under Section 564(b)(1) of the Act, 21 U.S.C.section 360bbb-3(b)(1), unless the authorization is terminated  or revoked sooner.       Influenza A by PCR NEGATIVE NEGATIVE Final   Influenza B by PCR NEGATIVE NEGATIVE Final    Comment: (NOTE) The Xpert Xpress SARS-CoV-2/FLU/RSV plus assay is intended as an aid in the diagnosis of influenza from Nasopharyngeal swab specimens and should not be used as a sole basis for treatment. Nasal washings and aspirates are unacceptable for Xpert Xpress SARS-CoV-2/FLU/RSV testing.  Fact Sheet for Patients: BloggerCourse.com  Fact Sheet for Healthcare Providers: SeriousBroker.it  This test is not yet approved or cleared by the [place] FDA and has been authorized for detection and/or diagnosis of SARS-CoV-2 by FDA under an Emergency Use Authorization (EUA). This EUA will remain in effect (meaning this test can be used) for the duration of the COVID-19 declaration under Section 564(b)(1) of the Act, 21 U.S.C. section 360bbb-3(b)(1), unless the authorization is terminated or revoked.  Performed at [facility], [address] [place], [place] [address]   Blood culture (routine x 2)     Status: None (Preliminary result)   Collection Time: 04/28/21  4:59 PM   Specimen:  BLOOD RIGHT HAND  Result Value Ref Range Status   Specimen Description   Final    BLOOD RIGHT HAND Performed at [facility], [address], [place], [place] [address]    Special Requests   Final     BOTTLES DRAWN AEROBIC ONLY Blood Culture results may not be optimal due to an inadequate volume of blood received in culture bottles Performed at [facility], [address] [place], [place] [address]    Culture  Setup Time   Final    GRAM POSITIVE COCCI AEROBIC BOTTLE Gram Stain Report Called to,Read Back By and Verified With: [email or web address]  BY MATTHEWS, B 10.19.22 GRAM POSITIVE RODS CRITICAL RESULT CALLED TO, READ BACK BY AND VERIFIED WITH: PHARMD S.HALL AT 1000 ON 04/30/2021 BY T.SAAD. Performed at [facility], [address] [place], [place] [address]    Culture GRAM POSITIVE COCCI GRAM POSITIVE RODS   Final   Report Status PENDING  Incomplete  Blood culture (routine x 2)     Status: None (Preliminary result)   Collection Time: 04/28/21  4:59 PM   Specimen: BLOOD LEFT HAND  Result Value Ref Range Status   Specimen Description BLOOD LEFT HAND  Final   Special Requests   Final    BOTTLES DRAWN AEROBIC ONLY Blood Culture results may not be optimal due to an inadequate volume of blood received in culture bottles   Culture   Final    NO GROWTH 2 DAYS Performed at [facility], [address], [place], [place] [address]    Report Status PENDING  Incomplete  Blood Culture ID Panel (Reflexed)     Status: Abnormal   Collection Time: 04/28/21  4:59 PM  Result Value Ref Range Status   Enterococcus faecalis NOT DETECTED NOT DETECTED Final   Enterococcus Faecium NOT DETECTED NOT DETECTED Final   Listeria monocytogenes NOT DETECTED NOT DETECTED Final   Staphylococcus species DETECTED (A) NOT DETECTED Final    Comment: CRITICAL RESULT CALLED TO, READ BACK BY AND VERIFIED WITH: PHARMD S.HALL AT 1000 ON 04/30/2021 BY T.SAAD.    Staphylococcus aureus (BCID) NOT DETECTED NOT DETECTED Final   Staphylococcus epidermidis DETECTED (A) NOT DETECTED Final    Comment: CRITICAL RESULT CALLED TO, READ BACK BY AND VERIFIED WITH: PHARMD S.HALL AT 1000 ON 04/30/2021 BY T.SAAD.     Staphylococcus lugdunensis NOT DETECTED NOT DETECTED Final   Streptococcus species NOT DETECTED NOT DETECTED Final   Streptococcus agalactiae NOT DETECTED NOT DETECTED Final   Streptococcus pneumoniae NOT DETECTED NOT DETECTED Final   Streptococcus pyogenes NOT DETECTED NOT DETECTED Final   A.calcoaceticus-baumannii NOT DETECTED NOT DETECTED Final   Bacteroides fragilis NOT DETECTED NOT DETECTED Final   Enterobacterales NOT DETECTED NOT DETECTED Final   Enterobacter cloacae complex NOT DETECTED NOT DETECTED Final   Escherichia coli NOT DETECTED NOT DETECTED Final   Klebsiella aerogenes NOT DETECTED NOT DETECTED Final   Klebsiella oxytoca NOT DETECTED NOT DETECTED Final   Klebsiella pneumoniae NOT DETECTED NOT DETECTED Final   Proteus species NOT DETECTED NOT DETECTED Final   Salmonella species NOT DETECTED NOT DETECTED Final   Serratia marcescens NOT DETECTED NOT DETECTED Final   Haemophilus influenzae NOT DETECTED NOT DETECTED Final   Neisseria meningitidis NOT DETECTED NOT DETECTED Final   Pseudomonas aeruginosa NOT DETECTED NOT DETECTED Final   Stenotrophomonas maltophilia NOT DETECTED NOT DETECTED Final   Candida albicans NOT DETECTED NOT DETECTED Final   Candida auris NOT DETECTED NOT DETECTED Final   Candida  glabrata NOT DETECTED NOT DETECTED Final   Candida krusei NOT DETECTED NOT DETECTED Final   Candida parapsilosis NOT DETECTED NOT DETECTED Final   Candida tropicalis NOT DETECTED NOT DETECTED Final   Cryptococcus neoformans/gattii NOT DETECTED NOT DETECTED Final   Methicillin resistance mecA/C NOT DETECTED NOT DETECTED Final    Comment: Performed at [facility], [address] [place], [place] [address]  MRSA Next Gen by PCR, Nasal     Status: None   Collection Time: 04/28/21 11:30 PM   Specimen: Nasal Mucosa; Nasal Swab  Result Value Ref Range Status   MRSA by PCR Next Gen NOT DETECTED NOT DETECTED Final    Comment: (NOTE) The GeneXpert MRSA Assay (FDA approved  for NASAL specimens only), is one component of a comprehensive MRSA colonization surveillance program. It is not intended to diagnose MRSA infection nor to guide or monitor treatment for MRSA infections. Test performance is not FDA approved in patients less than 27 years old. Performed at [facility], [address] [place], [place] [address]   Culture, blood (routine x 2)     Status: None (Preliminary result)   Collection Time: 04/30/21  5:11 AM   Specimen: BLOOD LEFT ARM  Result Value Ref Range Status   Specimen Description BLOOD LEFT ARM  Final   Special Requests   Final    Blood Culture adequate volume BOTTLES DRAWN AEROBIC AND ANAEROBIC   Culture   Final    NO GROWTH < 12 HOURS Performed at [facility], [address], [place], [place] [address]    Report Status PENDING  Incomplete  Culture, blood (routine x 2)     Status: None (Preliminary result)   Collection Time: 04/30/21  5:12 AM   Specimen: BLOOD RIGHT ARM  Result Value Ref Range Status   Specimen Description BLOOD RIGHT ARM  Final   Special Requests   Final    Blood Culture adequate volume BOTTLES DRAWN AEROBIC ONLY   Culture   Final    NO GROWTH < 12 HOURS Performed at [facility], [address], [place], [place] [address]    Report Status PENDING  Incomplete    Time coordinating discharge: 30 minutes.  SIGNED:  Erick Blinks, MD  [facility] 04/30/2021, 11:49 AM How to contact the [facility] Attending or Consulting provider 7A - 7P or covering provider during after hours 7P -7A, for this patient?  Check the care team in [facility] and look for a) attending/consulting [facility] provider listed and b) the [facility] team listed Log into [email or web address] and use [facility]'s universal password to access. If you do not have the password, please contact the hospital operator. Locate the [facility] provider you are looking for under [facility] and page to a number that you can be directly reached. If you still have difficulty  reaching the provider, please page the [facility] (Director on Call) for the Hospitalists listed on amion for assistance.

## 2021-05-03 LAB — CULTURE, BLOOD (ROUTINE X 2): Culture: NO GROWTH

## 2021-05-05 LAB — CULTURE, BLOOD (ROUTINE X 2)
Culture: NO GROWTH
Culture: NO GROWTH
Special Requests: ADEQUATE
Special Requests: ADEQUATE

## 2021-05-05 NOTE — Addendum Note (Signed)
Addended by: Sandria Senter C on: 05/05/2021 11:04 AM   Modules accepted: Orders

## 2021-05-05 NOTE — Telephone Encounter (Signed)
Called central scheduling to schedule CT abdomen. Scheduler noted CT pancreas order also in chart. Dr. Wilburt Finlay office had placed order 04/30/21 for CT pancreas. Pt also has hospital f/u appt with Dr. Levon Hedger 06/11/21. I cancelled order I had placed for CT since she is going to be followed by Dr. Wilburt Finlay office.  FYI to [employer] PA.

## 2021-05-05 NOTE — Addendum Note (Signed)
Addended by: Sandria Senter C on: 05/05/2021 10:57 AM   Modules accepted: Orders

## 2021-05-05 NOTE — Telephone Encounter (Signed)
Noted! Thank you

## 2021-05-21 ENCOUNTER — Other Ambulatory Visit: Payer: Self-pay

## 2021-05-21 ENCOUNTER — Emergency Department (HOSPITAL_COMMUNITY): Payer: Medicaid Other

## 2021-05-21 ENCOUNTER — Emergency Department (HOSPITAL_COMMUNITY)
Admission: EM | Admit: 2021-05-21 | Discharge: 2021-05-22 | Disposition: A | Discharge status: Home / Self Care | Payer: Medicaid Other | Attending: Emergency Medicine | Admitting: Emergency Medicine

## 2021-05-21 ENCOUNTER — Encounter (HOSPITAL_COMMUNITY): Payer: Self-pay | Admitting: *Deleted

## 2021-05-21 DIAGNOSIS — Z20822 Contact with and (suspected) exposure to covid-19: Secondary | ICD-10-CM | POA: Insufficient documentation

## 2021-05-21 DIAGNOSIS — J101 Influenza due to other identified influenza virus with other respiratory manifestations: Secondary | ICD-10-CM | POA: Diagnosis not present

## 2021-05-21 DIAGNOSIS — Z79899 Other long term (current) drug therapy: Secondary | ICD-10-CM | POA: Insufficient documentation

## 2021-05-21 DIAGNOSIS — N390 Urinary tract infection, site not specified: Secondary | ICD-10-CM

## 2021-05-21 DIAGNOSIS — R509 Fever, unspecified: Secondary | ICD-10-CM

## 2021-05-21 DIAGNOSIS — J111 Influenza due to unidentified influenza virus with other respiratory manifestations: Secondary | ICD-10-CM

## 2021-05-21 DIAGNOSIS — R059 Cough, unspecified: Secondary | ICD-10-CM | POA: Diagnosis present

## 2021-05-21 LAB — CBC
HCT: 43.5 % (ref 36.0–46.0)
Hemoglobin: 13.8 g/dL (ref 12.0–15.0)
MCH: 27.3 pg (ref 26.0–34.0)
MCHC: 31.7 g/dL (ref 30.0–36.0)
MCV: 86.1 fL (ref 80.0–100.0)
Platelets: 273 10*3/uL (ref 150–400)
RBC: 5.05 MIL/uL (ref 3.87–5.11)
RDW: 14 % (ref 11.5–15.5)
WBC: 10.2 10*3/uL (ref 4.0–10.5)
nRBC: 0 % (ref 0.0–0.2)

## 2021-05-21 LAB — COMPREHENSIVE METABOLIC PANEL
ALT: 29 U/L (ref 0–44)
AST: 22 U/L (ref 15–41)
Albumin: 4.3 g/dL (ref 3.5–5.0)
Alkaline Phosphatase: 91 U/L (ref 38–126)
Anion gap: 11 (ref 5–15)
BUN: 9 mg/dL (ref 6–20)
CO2: 25 mmol/L (ref 22–32)
Calcium: 9.2 mg/dL (ref 8.9–10.3)
Chloride: 99 mmol/L (ref 98–111)
Creatinine, Ser: 0.8 mg/dL (ref 0.44–1.00)
GFR, Estimated: >60 mL/min (ref >60)
Glucose, Bld: 160 mg/dL — ABNORMAL HIGH (ref 70–99)
Potassium: 3.6 mmol/L (ref 3.5–5.1)
Sodium: 135 mmol/L (ref 135–145)
Total Bilirubin: 0.5 mg/dL (ref 0.3–1.2)
Total Protein: 8 g/dL (ref 6.5–8.1)

## 2021-05-21 LAB — LIPASE, BLOOD: Lipase: 20 U/L (ref 11–51)

## 2021-05-21 LAB — URINALYSIS, ROUTINE W REFLEX MICROSCOPIC
Bacteria, UA: NONE SEEN
Bilirubin Urine: NEGATIVE
Glucose, UA: NEGATIVE mg/dL
Hgb urine dipstick: NEGATIVE
Ketones, ur: 5 mg/dL — AB
Nitrite: NEGATIVE
Protein, ur: 30 mg/dL — AB
Specific Gravity, Urine: 1.029 (ref 1.005–1.030)
pH: 5 (ref 5.0–8.0)

## 2021-05-21 LAB — RESP PANEL BY RT-PCR (FLU A&B, COVID) ARPGX2
Influenza A by PCR: POSITIVE — AB
Influenza B by PCR: NEGATIVE
SARS Coronavirus 2 by RT PCR: NEGATIVE

## 2021-05-21 LAB — RAPID URINE DRUG SCREEN, HOSP PERFORMED
Amphetamines: NOT DETECTED
Barbiturates: NOT DETECTED
Benzodiazepines: NOT DETECTED
Cocaine: NOT DETECTED
Opiates: NOT DETECTED
Tetrahydrocannabinol: NOT DETECTED

## 2021-05-21 MED ORDER — LACTATED RINGERS IV BOLUS
1000.0000 mL | Freq: Once | INTRAVENOUS | Status: AC
  Administered 2021-05-21: 1000 mL via INTRAVENOUS

## 2021-05-21 MED ORDER — CEPHALEXIN 500 MG PO CAPS: 500.0000 mg | ORAL_CAPSULE | Freq: Four times a day (QID) | ORAL | 0 refills | Status: DC

## 2021-05-21 MED ORDER — ONDANSETRON HCL 4 MG/2ML IJ SOLN
4.0000 mg | Freq: Once | INTRAMUSCULAR | Status: AC
  Administered 2021-05-21: 4 mg via INTRAVENOUS
  Filled 2021-05-21: qty 2

## 2021-05-21 MED ORDER — ACETAMINOPHEN 500 MG PO TABS
1000.0000 mg | ORAL_TABLET | Freq: Once | ORAL | Status: AC
  Administered 2021-05-21: 1000 mg via ORAL
  Filled 2021-05-21: qty 2

## 2021-05-21 MED ORDER — IBUPROFEN 400 MG PO TABS
600.0000 mg | ORAL_TABLET | Freq: Once | ORAL | Status: AC
  Administered 2021-05-21: 600 mg via ORAL
  Filled 2021-05-21: qty 2

## 2021-05-21 MED ORDER — SODIUM CHLORIDE 0.9 % IV SOLN
1.0000 g | Freq: Once | INTRAVENOUS | Status: AC
  Administered 2021-05-21: 1 g via INTRAVENOUS
  Filled 2021-05-21: qty 10

## 2021-05-21 NOTE — ED Provider Notes (Signed)
[facility] EMERGENCY DEPARTMENT Provider Note   CSN: [id number] Arrival date & time: 05/21/21  1733     History Chief Complaint  Patient presents with   Emesis    Charlene Key is a 30 y.o. female.  Pt c/o non prod cough, and nausea/vomiting. Symptoms acute onset in past couple days with non prod cough, fever, and today with episode nausea/vomiting and a diarrhea stool. No bloody or bilious emesis. No abd distension or pain. No bloody bms. Denies recent abx use. No specific known covid/flu exposure although family members currently with uri symptoms. No sore throat or trouble swallowing. Mild sob w coughing spells. Denies hx asthma. No chest pain or discomfort. No leg pain or swelling.   The history is provided by the patient and medical records.  Emesis Associated symptoms: cough and fever   Associated symptoms: no abdominal pain, no chills, no headaches and no sore throat       History reviewed. No pertinent past medical history.  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 No pertinent family history.  Social History   Tobacco Use   Smoking status: Never   Smokeless tobacco: Never  Substance Use Topics   Alcohol use: No   Drug use: No    Home Medications Prior to Admission medications   Medication Sig Start Date End Date Taking? Authorizing Provider  acetaminophen (TYLENOL) 325 MG tablet Take 650 mg by mouth every 6 (six) hours as needed. 12/24/16   [provider]  albuterol (VENTOLIN HFA) 108 (90 Base) MCG/ACT inhaler Inhale 1-2 puffs into the lungs every 6 (six) hours as needed for wheezing or shortness of breath. 04/30/21   Manuella Ghazi, Pratik D, DO  ferrous sulfate 324 MG TBEC Take by mouth. Patient not taking: No sig reported 09/29/15   [provider]  guaiFENesin-dextromethorphan (ROBITUSSIN DM) 100-10 MG/5ML syrup Take 5 mLs by mouth every 4 (four) hours as needed for cough. 04/30/21   Manuella Ghazi, Pratik D, DO  Prenat w/o A-FE-Methfol-FA-DHA (PNV-DHA) 27-0.6-0.4-300 MG CAPS Take 1 capsule by mouth daily. Patient not taking: No sig reported    [provider]  Vitamin D, Ergocalciferol, (DRISDOL) 1.25 MG (50000 UNIT) CAPS capsule Take 1 capsule (50,000 Units total) by mouth every 7 (seven) days. 05/06/21  Sherryll Burger, Pratik D, DO    Allergies    Patient has no known allergies.  Review of Systems   Review of Systems  Constitutional:  Positive for fever. Negative for chills.  HENT:  Positive for congestion. Negative for sore throat.   Eyes:  Negative for redness.  Respiratory:  Positive for cough and shortness of breath.   Cardiovascular:  Negative for chest pain and leg swelling.  Gastrointestinal:  Positive for vomiting. Negative for abdominal distention and abdominal pain.  Genitourinary:  Negative for dysuria and flank pain.  Musculoskeletal:  Negative for back pain, neck pain and neck stiffness.  Skin:  Negative for rash.  Neurological:  Negative for headaches.   Hematological:  Does not bruise/bleed easily.  Psychiatric/Behavioral:  Negative for confusion.    Physical Exam Updated Vital Signs BP (!) 89/63 (BP Location: Left Arm)   Pulse (!) 115   Temp (!) 102.7 F (39.3 C) (Oral)   Resp 20   Ht 1.6 m (5\' 3" )   Wt 104.3 kg   LMP 06/10/2015   SpO2 91%   BMI 40.74 kg/m   Physical Exam Vitals and nursing note reviewed.  Constitutional:      Appearance: Normal appearance. She is well-developed.  HENT:     Head: Atraumatic.     Right Ear: Tympanic membrane normal.     Left Ear: Tympanic membrane normal.     Ears:     Comments: No mastoid tenderness.     Nose: Nose normal.     Mouth/Throat:     Mouth: Mucous membranes are moist.     Pharynx: Oropharynx is clear. No oropharyngeal exudate or posterior oropharyngeal erythema.  Eyes:     General: No scleral icterus.    Conjunctiva/sclera: Conjunctivae normal.     Pupils: Pupils are equal, round, and reactive to light.  Neck:     Trachea: No tracheal deviation.     Comments: No stiffness or rigidity. Trachea midline.  Cardiovascular:     Rate and Rhythm: Normal rate and regular rhythm.     Pulses: Normal pulses.     Heart sounds: Normal heart sounds. No murmur heard.   No friction rub. No gallop.  Pulmonary:     Effort: Pulmonary effort is normal. No respiratory distress.     Breath sounds: Normal breath sounds. No stridor.  Abdominal:     General: Bowel sounds are normal. There is no distension.     Palpations: Abdomen is soft. There is no mass.     Tenderness: There is no abdominal tenderness. There is no guarding or rebound.     Hernia: No hernia is present.  Genitourinary:    Comments: No cva tenderness.  Musculoskeletal:        General: No swelling or tenderness.     Cervical back: Normal range of motion and neck supple. No rigidity. No muscular tenderness.     Right lower leg: No edema.     Left lower leg: No edema.  Lymphadenopathy:     Cervical: No cervical  adenopathy.  Skin:    General: Skin is warm and dry.     Findings: No rash.  Neurological:     Mental Status: She is alert.     Comments: Alert, speech normal.   Psychiatric:        Mood and Affect: Mood normal.    ED Results / Procedures / Treatments   Labs (all labs ordered are listed, but only abnormal results are displayed) Results  for orders placed or performed during the hospital encounter of 05/21/21  Resp Panel by RT-PCR (Flu A&B, Covid) Nasopharyngeal Swab   Specimen: Nasopharyngeal Swab; Nasopharyngeal(NP) swabs in vial transport medium  Result Value Ref Range   SARS Coronavirus 2 by RT PCR NEGATIVE NEGATIVE   Influenza A by PCR POSITIVE (A) NEGATIVE   Influenza B by PCR NEGATIVE NEGATIVE  CBC  Result Value Ref Range   WBC 10.2 4.0 - 10.5 K/uL   RBC 5.05 3.87 - 5.11 MIL/uL   Hemoglobin 13.8 12.0 - 15.0 g/dL   HCT 43.5 36.0 - 46.0 %   MCV 86.1 80.0 - 100.0 fL   MCH 27.3 26.0 - 34.0 pg   MCHC 31.7 30.0 - 36.0 g/dL   RDW 14.0 11.5 - 15.5 %   Platelets 273 150 - 400 K/uL   nRBC 0.0 0.0 - 0.2 %  Comprehensive metabolic panel  Result Value Ref Range   Sodium 135 135 - 145 mmol/L   Potassium 3.6 3.5 - 5.1 mmol/L   Chloride 99 98 - 111 mmol/L   CO2 25 22 - 32 mmol/L   Glucose, Bld 160 (H) 70 - 99 mg/dL   BUN 9 6 - 20 mg/dL   Creatinine, Ser 0.80 0.44 - 1.00 mg/dL   Calcium 9.2 8.9 - 10.3 mg/dL   Total Protein 8.0 6.5 - 8.1 g/dL   Albumin 4.3 3.5 - 5.0 g/dL   AST 22 15 - 41 U/L   ALT 29 0 - 44 U/L   Alkaline Phosphatase 91 38 - 126 U/L   Total Bilirubin 0.5 0.3 - 1.2 mg/dL   GFR, Estimated >60 >60 mL/min   Anion gap 11 5 - 15  Lipase, blood  Result Value Ref Range   Lipase 20 11 - 51 U/L  Urinalysis, Routine w reflex microscopic Urine, Clean Catch  Result Value Ref Range   Color, Urine YELLOW YELLOW   APPearance HAZY (A) CLEAR   Specific Gravity, Urine 1.029 1.005 - 1.030   pH 5.0 5.0 - 8.0   Glucose, UA NEGATIVE NEGATIVE mg/dL   Hgb urine dipstick  NEGATIVE NEGATIVE   Bilirubin Urine NEGATIVE NEGATIVE   Ketones, ur 5 (A) NEGATIVE mg/dL   Protein, ur 30 (A) NEGATIVE mg/dL   Nitrite NEGATIVE NEGATIVE   Leukocytes,Ua MODERATE (A) NEGATIVE   RBC / HPF 6-10 0 - 5 RBC/hpf   WBC, UA 11-20 0 - 5 WBC/hpf   Bacteria, UA NONE SEEN NONE SEEN   Squamous Epithelial / LPF 0-5 0 - 5   Mucus PRESENT   Rapid urine drug screen (hospital performed)  Result Value Ref Range   Opiates NONE DETECTED NONE DETECTED   Cocaine NONE DETECTED NONE DETECTED   Benzodiazepines NONE DETECTED NONE DETECTED   Amphetamines NONE DETECTED NONE DETECTED   Tetrahydrocannabinol NONE DETECTED NONE DETECTED   Barbiturates NONE DETECTED NONE DETECTED   DG Chest 2 View  Result Date: 04/27/2021 CLINICAL DATA:  Cough and history of prior pneumonia EXAM: CHEST - 2 VIEW COMPARISON:  04/19/2021 FINDINGS: The heart size and mediastinal contours are within normal limits. Both lungs are clear. The visualized skeletal structures are unremarkable. IMPRESSION: No active cardiopulmonary disease. Electronically Signed   By: Inez Catalina M.D.   On: 04/27/2021 23:07   CT Angio Chest PE W/Cm &/Or Wo Cm  Result Date: 04/28/2021 CLINICAL DATA:  Chest pressure and shortness of breath for the past 2 weeks. EXAM: CT ANGIOGRAPHY CHEST WITH CONTRAST TECHNIQUE: Multidetector CT imaging  of the chest was performed using the standard protocol during bolus administration of intravenous contrast. Multiplanar CT image reconstructions and MIPs were obtained to evaluate the vascular anatomy. CONTRAST:  72mL OMNIPAQUE IOHEXOL 350 MG/ML SOLN COMPARISON:  Chest x-ray from same day. FINDINGS: Cardiovascular: Satisfactory opacification of the pulmonary arteries to the segmental level. No evidence of pulmonary embolism. Normal heart size. No pericardial effusion. No thoracic aortic aneurysm or dissection. Mediastinum/Nodes: No enlarged mediastinal, hilar, or axillary lymph nodes. Thyroid gland, trachea, and  esophagus demonstrate no significant findings. Lungs/Pleura: Minimal subsegmental atelectasis and/or scarring in the medial right middle lobe. No focal consolidation, pleural effusion, or pneumothorax. Upper Abdomen: No acute abnormality. 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 Musculoskeletal: No chest wall abnormality. No acute or significant osseous findings. Review of the MIP images confirms the above findings. IMPRESSION: 1. No evidence of pulmonary embolism. No acute intrathoracic process. 2. 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 Recommend further evaluation with outpatient pancreatic protocol CT or MRI with and without contrast. Electronically Signed   By: Titus Dubin M.D.   On: 04/28/2021 17:31   DG Chest Port 1 View  Result Date: 05/21/2021 CLINICAL DATA:  Cough, fever, vomiting, diarrhea.  Nonsmoker EXAM: PORTABLE CHEST 1 VIEW COMPARISON:  Chest x-ray 04/28/2021, CT chest 04/28/2021 FINDINGS: The heart and mediastinal contours are unchanged. No focal consolidation. No pulmonary edema. No pleural effusion. No pneumothorax. No acute osseous abnormality. IMPRESSION: No active disease. Electronically Signed   By: Iven Finn M.D.   On: 05/21/2021 18:51   DG Chest Port 1 View  Result Date: 04/28/2021 CLINICAL DATA:  Shortness of breath. EXAM: PORTABLE CHEST 1 VIEW COMPARISON:  Chest x-ray 04/27/2021. FINDINGS: The heart size and mediastinal contours are within normal limits. Both lungs are clear. The visualized skeletal structures are unremarkable. IMPRESSION: No active disease. Electronically Signed   By: Ronney Asters M.D.   On: 04/28/2021 16:31   ECHOCARDIOGRAM COMPLETE  Result Date: 04/29/2021    ECHOCARDIOGRAM REPORT   Patient Name:   Charlene Key Date of Exam: 04/29/2021 Medical Rec #:  [id number]  Height:       63.0 in Accession #:    [id number] Weight:       227.3  lb Date of Birth:  [birth year]  BSA:          2.042 m Patient Age:    74 years   BP:           133/67 mmHg Patient Gender: F          HR:           116 bpm. Exam Location:  [facility] Procedure: 2D Echo, Cardiac Doppler and Color Doppler Indications:    Tachycardia  History:        Patient has no prior history of Echocardiogram examinations.                 Signs/Symptoms:Dyspnea; Risk Factors:Obesity. Pneumonia.  Sonographer:    Dustin Flock RDCS Referring Phys: [place]  1. Left ventricular ejection fraction, by estimation, is 65 to 70%. The left ventricle has normal function. The left ventricle has no regional wall motion abnormalities. Left ventricular diastolic parameters were normal.  2. Right ventricular systolic function is normal. The right ventricular size is normal. Tricuspid regurgitation signal is inadequate for assessing PA pressure.  3. The mitral  valve is grossly normal. No evidence of mitral valve regurgitation.  4. The aortic valve was not well visualized. Aortic valve regurgitation is not visualized.  5. The inferior vena cava is normal in size with greater than 50% respiratory variability, suggesting right atrial pressure of 3 mmHg. Comparison(s): No prior Echocardiogram. FINDINGS  Left Ventricle: Left ventricular ejection fraction, by estimation, is 65 to 70%. The left ventricle has normal function. The left ventricle has no regional wall motion abnormalities. The left ventricular internal cavity size was normal in size. There is  borderline left ventricular hypertrophy. Left ventricular diastolic parameters were normal. Right Ventricle: The right ventricular size is normal. No increase in right ventricular wall thickness. Right ventricular systolic function is normal. Tricuspid regurgitation signal is inadequate for assessing PA pressure. Left Atrium: Left atrial size was normal in size. Right Atrium: Right atrial size was normal in size. Pericardium: There is  no evidence of pericardial effusion. Mitral Valve: The mitral valve is grossly normal. No evidence of mitral valve regurgitation. Tricuspid Valve: The tricuspid valve is grossly normal. Tricuspid valve regurgitation is trivial. Aortic Valve: The aortic valve was not well visualized. There is mild aortic valve annular calcification. Aortic valve regurgitation is not visualized. Pulmonic Valve: The pulmonic valve was grossly normal. Pulmonic valve regurgitation is trivial. Aorta: The aortic root is normal in size and structure. Venous: The inferior vena cava is normal in size with greater than 50% respiratory variability, suggesting right atrial pressure of 3 mmHg. IAS/Shunts: No atrial level shunt detected by color flow Doppler.  LEFT VENTRICLE PLAX 2D LVIDd:         4.00 cm   Diastology LVIDs:         2.60 cm   LV e' medial:    15.20 cm/s LV PW:         1.10 cm   LV E/e' medial:  6.4 LV IVS:        1.00 cm   LV e' lateral:   12.30 cm/s LVOT diam:     2.00 cm   LV E/e' lateral: 7.9 LV SV:         63 LV SV Index:   31 LVOT Area:     3.14 cm  RIGHT VENTRICLE RV Basal diam:  2.70 cm RV S prime:     28.30 cm/s TAPSE (M-mode): 2.4 cm LEFT ATRIUM             Index        RIGHT ATRIUM           Index LA diam:        2.40 cm 1.18 cm/m   RA Area:     12.80 cm LA Vol (A2C):   28.9 ml 14.15 ml/m  RA Volume:   31.20 ml  15.28 ml/m LA Vol (A4C):   40.9 ml 20.03 ml/m LA Biplane Vol: 35.0 ml 17.14 ml/m  AORTIC VALVE LVOT Vmax:   129.00 cm/s LVOT Vmean:  77.900 cm/s LVOT VTI:    0.201 m  AORTA Ao Root diam: 2.40 cm MITRAL VALVE MV Area (PHT): 6.65 cm     SHUNTS MV Decel Time: 114 msec     Systemic VTI:  0.20 m MV E velocity: 96.70 cm/s   Systemic Diam: 2.00 cm MV A velocity: 111.00 cm/s MV E/A ratio:  0.87 Rozann Lesches MD Electronically signed by Rozann Lesches MD Signature Date/Time: 04/29/2021/8:48:20 PM    Final     EKG None  Radiology DG Chest  Port 1 View  Result Date: 05/21/2021 CLINICAL DATA:  Cough, fever,  vomiting, diarrhea.  Nonsmoker EXAM: PORTABLE CHEST 1 VIEW COMPARISON:  Chest x-ray 04/28/2021, CT chest 04/28/2021 FINDINGS: The heart and mediastinal contours are unchanged. No focal consolidation. No pulmonary edema. No pleural effusion. No pneumothorax. No acute osseous abnormality. IMPRESSION: No active disease. Electronically Signed   By: Iven Finn M.D.   On: 05/21/2021 18:51    Procedures Procedures   Medications Ordered in ED Medications  ondansetron (ZOFRAN) injection 4 mg (has no administration in time range)    ED Course  I have reviewed the triage vital signs and the nursing notes.  Pertinent labs & imaging results that were available during my care of the patient were reviewed by me and considered in my medical decision making (see chart for details).    MDM Rules/Calculators/A&P                           Iv ns. Zofran iv. Labs sent. Cxr.   Reviewed nursing notes and prior charts for additional history. Recent visit reviewed. Pt indicates has f/u with gi arranged, and f/u imaging arranged. No abd pain. Recent cta for similar symptoms neg for PE.   Labs  reviewed/interpreted by me - wbc and hgb normal.   CXR reviewed/interpreted by me - no pna.   Po fluids. Acetaminophen po.   Additional labs reviewed/interpreted by me - flu is positive. Ua w mod le and some wbc - ?possible uti. Rocephin iv. Rrx for home.   No recurrent nausea/vomiting. Abd soft nt.   Breathing comfortably, o2 sats 97% room air currently.       Final Clinical Impression(s) / ED Diagnoses Final diagnoses:  None    Rx / DC Orders ED Discharge Orders     None        Lajean Saver, MD 05/21/21 2233

## 2021-05-21 NOTE — ED Notes (Signed)
Rn aware of pt's bp at this time. lisa

## 2021-05-21 NOTE — ED Triage Notes (Signed)
Pt with emesis since yesterday and diarrhea today.  Noted pt with fever in triage.

## 2021-05-21 NOTE — Discharge Instructions (Addendum)
It was our pleasure to provide your ER care today - we hope that you feel better.  Your flu test is positive. Your lab work also shows a possible urine infection. Drink plenty of fluids/stay well hydrated. Take acetaminophen and/or ibuprofen as need for fever/aches. Take antibiotic as prescribed.   Follow up with primary care doctor in one week if symptoms fail to improve/resolve.  Also follow up with your  GI doctor as planned.   Return to ER if worse, new symptoms, increased trouble breathing, severe abdominal pain, persistent vomiting, fainting, or other concern.

## 2021-05-31 ENCOUNTER — Ambulatory Visit
Admission: EM | Admit: 2021-05-31 | Discharge: 2021-05-31 | Disposition: A | Discharge status: Home / Self Care | Payer: Medicaid Other | Attending: Physician Assistant | Admitting: Physician Assistant

## 2021-05-31 ENCOUNTER — Other Ambulatory Visit: Payer: Self-pay

## 2021-05-31 ENCOUNTER — Encounter: Payer: Self-pay | Admitting: Emergency Medicine

## 2021-05-31 ENCOUNTER — Emergency Department (HOSPITAL_BASED_OUTPATIENT_CLINIC_OR_DEPARTMENT_OTHER): Payer: Medicaid Other

## 2021-05-31 ENCOUNTER — Encounter (HOSPITAL_BASED_OUTPATIENT_CLINIC_OR_DEPARTMENT_OTHER): Payer: Self-pay | Admitting: *Deleted

## 2021-05-31 ENCOUNTER — Emergency Department (HOSPITAL_BASED_OUTPATIENT_CLINIC_OR_DEPARTMENT_OTHER)
Admission: EM | Admit: 2021-05-31 | Discharge: 2021-06-01 | Disposition: A | Discharge status: Home / Self Care | Payer: Medicaid Other | Attending: Student | Admitting: Student

## 2021-05-31 DIAGNOSIS — R053 Chronic cough: Secondary | ICD-10-CM | POA: Diagnosis not present

## 2021-05-31 DIAGNOSIS — R509 Fever, unspecified: Secondary | ICD-10-CM | POA: Diagnosis not present

## 2021-05-31 DIAGNOSIS — R7401 Elevation of levels of liver transaminase levels: Secondary | ICD-10-CM | POA: Diagnosis not present

## 2021-05-31 DIAGNOSIS — Z20822 Contact with and (suspected) exposure to covid-19: Secondary | ICD-10-CM | POA: Diagnosis not present

## 2021-05-31 DIAGNOSIS — J029 Acute pharyngitis, unspecified: Secondary | ICD-10-CM | POA: Diagnosis not present

## 2021-05-31 DIAGNOSIS — R059 Cough, unspecified: Secondary | ICD-10-CM | POA: Diagnosis present

## 2021-05-31 LAB — COMPREHENSIVE METABOLIC PANEL
ALT: 48 U/L — ABNORMAL HIGH (ref 0–44)
AST: 26 U/L (ref 15–41)
Albumin: 4.3 g/dL (ref 3.5–5.0)
Alkaline Phosphatase: 78 U/L (ref 38–126)
Anion gap: 9 (ref 5–15)
BUN: 7 mg/dL (ref 6–20)
CO2: 27 mmol/L (ref 22–32)
Calcium: 10 mg/dL (ref 8.9–10.3)
Chloride: 99 mmol/L (ref 98–111)
Creatinine, Ser: 0.69 mg/dL (ref 0.44–1.00)
GFR, Estimated: >60 mL/min (ref >60)
Glucose, Bld: 125 mg/dL — ABNORMAL HIGH (ref 70–99)
Potassium: 3.9 mmol/L (ref 3.5–5.1)
Sodium: 135 mmol/L (ref 135–145)
Total Bilirubin: 0.4 mg/dL (ref 0.3–1.2)
Total Protein: 8.5 g/dL — ABNORMAL HIGH (ref 6.5–8.1)

## 2021-05-31 LAB — CBC WITH DIFFERENTIAL/PLATELET
Abs Immature Granulocytes: 0.07 10*3/uL (ref 0.00–0.07)
Basophils Absolute: 0 10*3/uL (ref 0.0–0.1)
Basophils Relative: 0 %
Eosinophils Absolute: 0.1 10*3/uL (ref 0.0–0.5)
Eosinophils Relative: 1 %
HCT: 41.7 % (ref 36.0–46.0)
Hemoglobin: 13.2 g/dL (ref 12.0–15.0)
Immature Granulocytes: 1 %
Lymphocytes Relative: 24 %
Lymphs Abs: 3.6 10*3/uL (ref 0.7–4.0)
MCH: 26.5 pg (ref 26.0–34.0)
MCHC: 31.7 g/dL (ref 30.0–36.0)
MCV: 83.6 fL (ref 80.0–100.0)
Monocytes Absolute: 1.6 10*3/uL — ABNORMAL HIGH (ref 0.1–1.0)
Monocytes Relative: 11 %
Neutro Abs: 9.9 10*3/uL — ABNORMAL HIGH (ref 1.7–7.7)
Neutrophils Relative %: 63 %
Platelets: 369 10*3/uL (ref 150–400)
RBC: 4.99 MIL/uL (ref 3.87–5.11)
RDW: 13.8 % (ref 11.5–15.5)
WBC: 15.3 10*3/uL — ABNORMAL HIGH (ref 4.0–10.5)
nRBC: 0 % (ref 0.0–0.2)

## 2021-05-31 LAB — RESP PANEL BY RT-PCR (FLU A&B, COVID) ARPGX2
Influenza A by PCR: NEGATIVE
Influenza B by PCR: NEGATIVE
SARS Coronavirus 2 by RT PCR: NEGATIVE

## 2021-05-31 LAB — GROUP A STREP BY PCR: Group A Strep by PCR: NOT DETECTED

## 2021-05-31 MED ORDER — LACTATED RINGERS IV BOLUS
1000.0000 mL | Freq: Once | INTRAVENOUS | Status: AC
  Administered 2021-05-31: 1000 mL via INTRAVENOUS

## 2021-05-31 MED ORDER — LIDOCAINE VISCOUS HCL 2 % MT SOLN
15.0000 mL | Freq: Once | OROMUCOSAL | Status: AC
  Administered 2021-05-31: 15 mL via OROMUCOSAL
  Filled 2021-05-31: qty 15

## 2021-05-31 MED ORDER — BENZONATATE 100 MG PO CAPS
100.0000 mg | ORAL_CAPSULE | Freq: Once | ORAL | Status: AC
  Administered 2021-05-31: 100 mg via ORAL
  Filled 2021-05-31: qty 1

## 2021-05-31 MED ORDER — IOHEXOL 300 MG/ML  SOLN
100.0000 mL | Freq: Once | INTRAMUSCULAR | Status: AC | PRN
  Administered 2021-05-31: 100 mL via INTRAVENOUS

## 2021-05-31 MED ORDER — PROMETHAZINE-DM 6.25-15 MG/5ML PO SYRP: 5.0000 mL | ORAL_SOLUTION | Freq: Four times a day (QID) | ORAL | 0 refills | Status: DC | PRN

## 2021-05-31 MED ORDER — BENZONATATE 100 MG PO CAPS: 100.0000 mg | ORAL_CAPSULE | Freq: Three times a day (TID) | ORAL | 0 refills | Status: DC

## 2021-05-31 NOTE — ED Provider Notes (Signed)
[facility]    CSN: [id number] Arrival date & time: 05/31/21  1351      History   Chief Complaint Chief Complaint  Patient presents with   Sore Throat   Otalgia    HPI Charlene Key is a 30 y.o. female.   Pt complains of persistent fevers that started about 2 weeks ago.  Reports diagnosed with flu 05/21/21.  She was diagnosed with COVID in September of this year.  She was seen in the ED recently and prescribed Keflex, she is still taking Keflex.  Reports persistent cough, neck stiffness, ear pain, and sore throat.  She reports tessalon pearls and cough syrup were prescribed, but were not at her pharmacy so she could not pick them up.    History reviewed. No pertinent past medical history.  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/28/2021   Post-operative state 01/12/2017   Abnormal uterine bleeding, postpartum 01/11/2017   History of herpes genitalis 10/27/2016   Pregnancy 10/16/2016   History of preterm delivery 09/27/2015   Short cervix 09/07/2015   Herpes simplex type 2 infection 09/05/2015   Obesity (BMI 30-39.9) 09/05/2015    Past Surgical History:  Procedure Laterality Date   ABDOMINAL HYSTERECTOMY Bilateral 01/12/2017   Procedure: HYSTERECTOMY ABDOMINAL BILATERAL SALPINGECTOMY;  Surgeon: Boykin Nearing, MD;  Location: [facility]S;  Service: Gynecology;  Laterality: Bilateral;   CESAREAN SECTION      OB History     Gravida  7   Para  6   Term  5   Preterm  1   AB      Living  6      SAB      IAB      Ectopic      Multiple  1   Live Births  7            Home Medications    Prior to Admission medications   Medication Sig Start Date End Date Taking? Authorizing Provider  benzonatate (TESSALON) 100 MG capsule Take 1 capsule (100 mg total) by mouth  every 8 (eight) hours. 05/31/21  Yes Ward, Lenise Arena, PA-C  cephALEXin (KEFLEX) 500 MG capsule Take 1 capsule (500 mg total) by mouth 4 (four) times daily. 05/22/21  Yes Lajean Saver, MD  promethazine-dextromethorphan (PROMETHAZINE-DM) 6.25-15 MG/5ML syrup Take 5 mLs by mouth 4 (four) times daily as needed for cough. 05/31/21  Yes Ward, Lenise Arena, PA-C  acetaminophen (TYLENOL) 325 MG tablet Take 650 mg by mouth every 6 (six) hours as needed. 12/24/16   [provider]  albuterol (VENTOLIN HFA) 108 (90 Base) MCG/ACT inhaler Inhale 1-2 puffs into the lungs every 6 (six) hours as needed for wheezing or shortness of breath. 04/30/21   Manuella Ghazi, Pratik D, DO  ferrous sulfate 324 MG TBEC Take by mouth. Patient not taking: Reported on 04/28/2021 09/29/15   [provider]  Prenat w/o A-FE-Methfol-FA-DHA (PNV-DHA) 27-0.6-0.4-300 MG CAPS Take 1 capsule by mouth daily. Patient not taking: Reported on 04/28/2021    [provider]  Vitamin D, Ergocalciferol, (DRISDOL) 1.25 MG (50000 UNIT) CAPS capsule Take 1 capsule (50,000 Units total) by mouth every 7 (seven) days. 05/06/21   Heath Lark D, DO    Family History History reviewed. No pertinent family history.  Social History Social History  Tobacco Use   Smoking status: Never   Smokeless tobacco: Never  Substance Use Topics   Alcohol use: No   Drug use: No     Allergies   Patient has no known allergies.   Review of Systems Review of Systems  Constitutional:  Positive for fever. Negative for chills.  HENT:  Positive for congestion, ear pain and sore throat.   Eyes:  Negative for pain and visual disturbance.  Respiratory:  Positive for cough. Negative for shortness of breath.   Cardiovascular:  Negative for chest pain and palpitations.  Gastrointestinal:  Negative for abdominal pain and vomiting.  Genitourinary:  Negative for dysuria and hematuria.  Musculoskeletal:  Positive for neck stiffness. Negative for  arthralgias and back pain.  Skin:  Negative for color change and rash.  Neurological:  Negative for seizures and syncope.  All other systems reviewed and are negative.   Physical Exam Triage Vital Signs ED Triage Vitals  Enc Vitals Group     BP 05/31/21 1538 115/76     Pulse Rate 05/31/21 1538 (!) 121     Resp 05/31/21 1538 20     Temp 05/31/21 1538 100.2 F (37.9 C)     Temp Source 05/31/21 1538 Oral     SpO2 05/31/21 1538 93 %     Weight --      Height --      Head Circumference --      Peak Flow --      Pain Score 05/31/21 1536 10     Pain Loc --      Pain Edu? --      Excl. in GC? --    No data found.  Updated Vital Signs BP 115/76 (BP Location: Right Arm)   Pulse (!) 121   Temp 100.2 F (37.9 C) (Oral)   Resp 20   LMP 06/10/2015   SpO2 93%   Breastfeeding No   Visual Acuity Right Eye Distance:   Left Eye Distance:   Bilateral Distance:    Right Eye Near:   Left Eye Near:    Bilateral Near:     Physical Exam Vitals and nursing note reviewed.  Constitutional:      General: She is not in acute distress.    Appearance: She is well-developed.  HENT:     Head: Normocephalic and atraumatic.     Mouth/Throat:     Pharynx: Uvula midline. Pharyngeal swelling and posterior oropharyngeal erythema present.  Eyes:     Conjunctiva/sclera: Conjunctivae normal.  Cardiovascular:     Rate and Rhythm: Normal rate and regular rhythm.     Heart sounds: No murmur heard. Pulmonary:     Effort: Pulmonary effort is normal. No accessory muscle usage or respiratory distress.     Breath sounds: Examination of the right-upper field reveals wheezing. Examination of the left-upper field reveals wheezing. Wheezing present.  Abdominal:     Palpations: Abdomen is soft.     Tenderness: There is no abdominal tenderness.  Musculoskeletal:        General: No swelling.     Cervical back: Neck supple.  Skin:    General: Skin is warm and dry.     Capillary Refill: Capillary refill  takes less than 2 seconds.  Neurological:     Mental Status: She is alert.  Psychiatric:        Mood and Affect: Mood normal.     UC Treatments / Results  Labs (all labs ordered are listed, but  only abnormal results are displayed) Labs Reviewed - No data to display  EKG   Radiology No results found.  Procedures Procedures (including critical care time)  Medications Ordered in UC Medications - No data to display  Initial Impression / Assessment and Plan / UC Course  I have reviewed the triage vital signs and the nursing notes.  Pertinent labs & imaging results that were available during my care of the patient were reviewed by me and considered in my medical decision making (see chart for details).     Persistent cough, tessalon pearls prescribed as well as cough medication.   Persistent fevers.  Pt reports she has had daily fevers for over two weeks now.  Positive for flu 05/21/21. She is currently on an antibiotic.  She also reports new neck stiffness that started about three days ago.  At this time with persistent fevers and neck stiffness recommend further evaluation in the ED. Pt requests that cough medication still be sent to pharmacy. Final Clinical Impressions(s) / UC Diagnoses   Final diagnoses:  Chronic cough  Persistent fever   Discharge Instructions   None    ED Prescriptions     Medication Sig Dispense Auth. Provider   benzonatate (TESSALON) 100 MG capsule Take 1 capsule (100 mg total) by mouth every 8 (eight) hours. 21 capsule Ward, Lenise Arena, PA-C   promethazine-dextromethorphan (PROMETHAZINE-DM) 6.25-15 MG/5ML syrup Take 5 mLs by mouth 4 (four) times daily as needed for cough. 118 mL Ward, Lenise Arena, PA-C      PDMP not reviewed this encounter.   Ward, Lenise Arena, PA-C 05/31/21 1635

## 2021-05-31 NOTE — ED Triage Notes (Signed)
Patient was seen at [facility] and advised her to come here.  Has been coughing for 2 months, sore, throat,  fever, neck stiffness and left ear pain. Pt was seen at [facility] and was informed that she had a viral infection.

## 2021-05-31 NOTE — ED Provider Notes (Signed)
[facility] EMERGENCY DEPT Provider Note   CSN: [id number] Arrival date & time: 05/31/21  1736     History Chief Complaint  Patient presents with   Fever   Sore Throat   Cough    Charlene Key is a 30 y.o. female who presents the emergency department for evaluation of fever, cough and sore throat.  Patient was transferred from urgent care due to concern for meningitis as the patient had initial complaints of neck stiffness.  Here in the emergency department she has full range of motion of neck with no meningismus.  Patient tested positive for influenza A on 05/21/2021.  She currently endorses sore throat and a swelling to the left side of her neck.  Denies chest pain, shortness of breath, abdominal pain, nausea, vomiting or other systemic symptoms.  Is primarily concerned about her persistent cough.  Is that she was seen at [facility] and told that she has a viral infection but was given Zofran and an extended course of Keflex.  She states that her vomiting only occurs after severe coughing fits.   Fever Associated symptoms: cough and sore throat   Associated symptoms: no chest pain, no chills, no dysuria, no ear pain, no rash and no vomiting   Sore Throat Pertinent negatives include no chest pain, no abdominal pain and no shortness of breath.  Cough Associated symptoms: fever and sore throat   Associated symptoms: no chest pain, no chills, no ear pain, no rash and no shortness of breath       History reviewed. No pertinent past medical history.  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 No pertinent family history.  Social History   Tobacco Use   Smoking status: Never   Smokeless tobacco: Never  Vaping Use   Vaping Use: Never used  Substance Use Topics   Alcohol use: Never   Drug use: No    Home Medications Prior to Admission medications   Medication Sig Start Date End Date Taking? Authorizing Provider  albuterol (VENTOLIN HFA) 108 (90 Base) MCG/ACT inhaler Inhale 1-2 puffs into the lungs every 6 (six) hours as needed for wheezing or shortness of breath. 04/30/21  Yes Shah, Pratik D, DO  cephALEXin (KEFLEX) 500 MG capsule Take 1 capsule (500 mg total)  by mouth 4 (four) times daily. 05/22/21  Yes Lajean Saver, MD  acetaminophen (TYLENOL) 325 MG tablet Take 650 mg by mouth every 6 (six) hours as needed. 12/24/16   [provider]  benzonatate (TESSALON) 100 MG capsule Take 1 capsule (100 mg total) by mouth every 8 (eight) hours. 05/31/21   Ward, Lenise Arena, PA-C  Vitamin D, Ergocalciferol, (DRISDOL) 1.25 MG (50000 UNIT) CAPS capsule Take 1 capsule (50,000 Units total) by mouth every 7 (seven) days. 05/06/21   Heath Lark D, DO    Allergies    Patient has no known allergies.  Review of Systems   Review of Systems  Constitutional:  Positive for fever. Negative for chills.  HENT:  Positive for sore throat. Negative for ear pain.   Eyes:  Negative for pain and visual  disturbance.  Respiratory:  Positive for cough. Negative for shortness of breath.   Cardiovascular:  Negative for chest pain and palpitations.  Gastrointestinal:  Negative for abdominal pain and vomiting.  Genitourinary:  Negative for dysuria and hematuria.  Musculoskeletal:  Negative for arthralgias and back pain.  Skin:  Negative for color change and rash.  Neurological:  Negative for seizures and syncope.  All other systems reviewed and are negative.  Physical Exam Updated Vital Signs BP 116/70 (BP Location: Right Arm)   Pulse (!) 132   Temp 99.9 F (37.7 C)   Resp 20   Ht 5\' 3"  (1.6 m)   Wt 104.3 kg   LMP 06/10/2015   SpO2 98%   BMI 40.74 kg/m   Physical Exam Vitals and nursing note reviewed.  Constitutional:      General: She is not in acute distress.    Appearance: She is well-developed.  HENT:     Head: Normocephalic and atraumatic.     Mouth/Throat:     Pharynx: Posterior oropharyngeal erythema present.  Eyes:     Conjunctiva/sclera: Conjunctivae normal.  Cardiovascular:     Rate and Rhythm: Normal rate and regular rhythm.     Heart sounds: No murmur heard. Pulmonary:     Effort: Pulmonary effort is normal. No respiratory distress.     Breath sounds: Normal breath sounds.  Abdominal:     Palpations: Abdomen is soft.     Tenderness: There is no abdominal tenderness.  Musculoskeletal:        General: No swelling.     Cervical back: Neck supple.  Skin:    General: Skin is warm and dry.     Capillary Refill: Capillary refill takes less than 2 seconds.  Neurological:     Mental Status: She is alert.  Psychiatric:        Mood and Affect: Mood normal.    ED Results / Procedures / Treatments   Labs (all labs ordered are listed, but only abnormal results are displayed) Labs Reviewed  COMPREHENSIVE METABOLIC PANEL - Abnormal; Notable for the following components:      Result Value   Glucose, Bld 125 (*)    Total Protein 8.5 (*)    ALT 48 (*)    All other  components within normal limits  CBC WITH DIFFERENTIAL/PLATELET - Abnormal; Notable for the following components:   WBC 15.3 (*)    Neutro Abs 9.9 (*)    Monocytes Absolute 1.6 (*)    All other components within normal limits  RESP PANEL BY RT-PCR (FLU A&B, COVID) ARPGX2  GROUP A STREP BY PCR  TSH    EKG None  Radiology DG  Chest 1 View  Result Date: 05/31/2021 CLINICAL DATA:  Cough EXAM: CHEST  1 VIEW COMPARISON:  05/26/2021 FINDINGS: Cardiac shadow is within normal limits. Lungs are well aerated bilaterally. No focal infiltrate or effusion is seen. No bony abnormality is noted. IMPRESSION: No active disease. Electronically Signed   By: Inez Catalina M.D.   On: 05/31/2021 21:35   CT Soft Tissue Neck W Contrast  Result Date: 05/31/2021 CLINICAL DATA:  Cough with sore throat and fever EXAM: CT NECK WITH CONTRAST TECHNIQUE: Multidetector CT imaging of the neck was performed using the standard protocol following the bolus administration of intravenous contrast. CONTRAST:  179mL OMNIPAQUE IOHEXOL 300 MG/ML  SOLN COMPARISON:  None. FINDINGS: PHARYNX AND LARYNX: The palatine tonsils are mildly enlarged. There is no peritonsillar abscess. No retropharyngeal abnormality. The epiglottis and larynx are normal. SALIVARY GLANDS: Normal parotid, submandibular and sublingual glands. THYROID: Normal. LYMPH NODES: No enlarged or abnormal density lymph nodes. VASCULAR: Major cervical vessels are patent. LIMITED INTRACRANIAL: Normal. VISUALIZED ORBITS: Normal. MASTOIDS AND VISUALIZED PARANASAL SINUSES: No fluid levels or advanced mucosal thickening. No mastoid effusion. SKELETON: No bony spinal canal stenosis. No lytic or blastic lesions. UPPER CHEST: Clear. OTHER: None. IMPRESSION: Mildly enlarged palatine tonsils, possibly indicating acute tonsillopharyngitis. No peritonsillar abscess or fluid collection. Electronically Signed   By: Ulyses Jarred M.D.   On: 05/31/2021 23:09    Procedures Procedures    Medications Ordered in ED Medications  lactated ringers bolus 1,000 mL (1,000 mLs Intravenous New Bag/Given 05/31/21 2204)  lidocaine (XYLOCAINE) 2 % viscous mouth solution 15 mL (15 mLs Mouth/Throat Given 05/31/21 2205)  iohexol (OMNIPAQUE) 300 MG/ML solution 100 mL (100 mLs Intravenous Contrast Given 05/31/21 2233)  benzonatate (TESSALON) capsule 100 mg (100 mg Oral Given 05/31/21 2337)    ED Course  I have reviewed the triage vital signs and the nursing notes.  Pertinent labs & imaging results that were available during my care of the patient were reviewed by me and considered in my medical decision making (see chart for details).    MDM Rules/Calculators/A&P                           Patient seen emergency department for evaluation of cough, fever, sore throat and urgent care concern for meningitis.  Physical exam reveals no evidence of neck stiffness, no meningismus, unremarkable cardiopulmonary exam outside of a regular tachycardia.  Patient was given a liter of fluid and her heart rate improved 20 points.  He arrives with a initial leukocytosis to 15.3 but laboratory evaluation otherwise unremarkable outside of a mild ALT elevation of 48.  COVID, flu negative.  Group A strep negative.  Due to the patient's concern about a lateral neck swelling, CT soft tissue neck was performed to rule out RPA or developing peritonsillar abscess.  This CT showed mild bilateral tonsillar swelling but no drainable abscess and the patient is currently already on Keflex.  She was encouraged to complete her course of antibiotics.  She was given viscous lidocaine here in the emergency department which greatly improved her sore throat and her cough ultimately improved while here in the emergency department as well.  She is given a dose of Tessalon Perles as she says this is worked well for her in the past.  A prescription for this medication was provided by urgent care providers and she will pick up this  prescription tomorrow.  On evaluation, patient states that her symptoms have improved  and at this time she is safe for discharge.  Patient presentation does appear to be consistent with a viral infection at this time we do not have a definitive diagnosis for her symptoms.  She was given strict return precautions which she voiced understanding and she will follow-up with her primary care physician.  Patient is hemodynamically stable on reevaluation and is safe for discharge.  Patient then discharged. Final Clinical Impression(s) / ED Diagnoses Final diagnoses:  Chronic cough    Rx / DC Orders ED Discharge Orders     None        Jeffory Snelgrove, Debe Coder, MD 05/31/21 2344

## 2021-05-31 NOTE — ED Triage Notes (Addendum)
Patient c/o sore throat and left ear pain x 2-3 days.   Patient c/o productive cough w/  "foamy white" sputum x 1 month.   Patient endorses fever at home.   Patient endorses fever and neck stiffness.   Patient endorses being Flu POSITIVE on 11/9.   Patient is currently taking Keflex, patient endorses sore throat began after this medication.

## 2021-06-01 LAB — TSH: TSH: 1.287 u[IU]/mL (ref 0.350–4.500)

## 2021-06-06 ENCOUNTER — Encounter: Payer: Self-pay | Admitting: Emergency Medicine

## 2021-06-06 ENCOUNTER — Ambulatory Visit (INDEPENDENT_AMBULATORY_CARE_PROVIDER_SITE_OTHER): Payer: Medicaid Other

## 2021-06-06 ENCOUNTER — Ambulatory Visit
Admission: EM | Admit: 2021-06-06 | Discharge: 2021-06-06 | Disposition: A | Discharge status: Home / Self Care | Payer: Medicaid Other | Attending: Urgent Care | Admitting: Urgent Care

## 2021-06-06 ENCOUNTER — Other Ambulatory Visit: Payer: Self-pay

## 2021-06-06 DIAGNOSIS — R059 Cough, unspecified: Secondary | ICD-10-CM

## 2021-06-06 DIAGNOSIS — R053 Chronic cough: Secondary | ICD-10-CM

## 2021-06-06 DIAGNOSIS — R0982 Postnasal drip: Secondary | ICD-10-CM

## 2021-06-06 DIAGNOSIS — M546 Pain in thoracic spine: Secondary | ICD-10-CM

## 2021-06-06 DIAGNOSIS — R0789 Other chest pain: Secondary | ICD-10-CM

## 2021-06-06 MED ORDER — LEVOCETIRIZINE DIHYDROCHLORIDE 5 MG PO TABS: 5.0000 mg | ORAL_TABLET | Freq: Every evening | ORAL | 0 refills | Status: DC

## 2021-06-06 MED ORDER — TIZANIDINE HCL 4 MG PO TABS: 4.0000 mg | ORAL_TABLET | Freq: Every day | ORAL | 0 refills | Status: DC

## 2021-06-06 MED ORDER — PREDNISOLONE 15 MG/5ML PO SOLN: 45.0000 mg | Freq: Every day | ORAL | 0 refills | Status: AC

## 2021-06-06 NOTE — ED Provider Notes (Signed)
[place]   MRN: [id number] DOB: Jun 17, 1991  Subjective:   Charlene Key is a 30 y.o. female presenting for 70-month history of persistent coughing, wheezing.  Patient has had multiple office visits since October.  Has had emergency room visits as well.  Work-up is as below.  She continues to cough.  Has used a mix of medicines including antibiotics, steroids, albuterol.  Currently she has not been using anything.  She does feel a persistent drainage on her throat that makes her want to cough.  However she also has random coughing fits, coughing fits after she eats.  Has not seen her PCP.  No current facility-administered medications for this encounter.  Current Outpatient Medications:    albuterol (VENTOLIN HFA) 108 (90 Base) MCG/ACT inhaler, Inhale 1-2 puffs into the lungs every 6 (six) hours as needed for wheezing or shortness of breath., Disp: 1 each, Rfl: 3   benzonatate (TESSALON) 100 MG capsule, Take 1 capsule (100 mg total) by mouth every 8 (eight) hours., Disp: 21 capsule, Rfl: 0   cephALEXin (KEFLEX) 500 MG capsule, Take 1 capsule (500 mg total) by mouth 4 (four) times daily., Disp: 20 capsule, Rfl: 0   acetaminophen (TYLENOL) 325 MG tablet, Take 650 mg by mouth every 6 (six) hours as needed., Disp: , Rfl:    Vitamin D, Ergocalciferol, (DRISDOL) 1.25 MG (50000 UNIT) CAPS capsule, Take 1 capsule (50,000 Units total) by mouth every 7 (seven) days., Disp: 5 capsule, Rfl: 0   No Known Allergies  History reviewed. No pertinent past medical history.   Past Surgical History:  Procedure Laterality Date   ABDOMINAL HYSTERECTOMY Bilateral 01/12/2017   Procedure: HYSTERECTOMY ABDOMINAL BILATERAL SALPINGECTOMY;  Surgeon: Schermerhorn, Gwen Her, MD;  Location: [facility] ORS;  Service: Gynecology;  Laterality: Bilateral;   CESAREAN SECTION      History reviewed. No pertinent family history.  Social History   Tobacco Use   Smoking status: Never   Smokeless tobacco: Never   Vaping Use   Vaping Use: Never used  Substance Use Topics   Alcohol use: Never   Drug use: No    ROS   Objective:   Vitals: BP 122/87   Pulse 91   Temp 98.8 F (37.1 C) (Oral)   Resp 20   LMP 06/10/2015   SpO2 97%   Physical Exam Constitutional:      General: She is not in acute distress.    Appearance: Normal appearance. She is well-developed. She is not ill-appearing, toxic-appearing or diaphoretic.  HENT:     Head: Normocephalic and atraumatic.     Nose: Nose normal.     Mouth/Throat:     Mouth: Mucous membranes are moist.  Eyes:     Extraocular Movements: Extraocular movements intact.     Pupils: Pupils are equal, round, and reactive to light.  Cardiovascular:     Rate and Rhythm: Normal rate and regular rhythm.     Pulses: Normal pulses.     Heart sounds: Normal heart sounds. No murmur heard.   No friction rub. No gallop.  Pulmonary:     Effort: Pulmonary effort is normal. No respiratory distress.     Breath sounds: Normal breath sounds. No stridor. No wheezing, rhonchi or rales.  Chest:     Chest wall: Tenderness present.  Musculoskeletal:       Arms:  Skin:    General: Skin is warm and dry.     Findings: No rash.  Neurological:     Mental Status:  She is alert and oriented to person, place, and time.  Psychiatric:        Mood and Affect: Mood normal.        Behavior: Behavior normal.        Thought Content: Thought content normal.    DG Chest 2 View  Result Date: 06/06/2021 CLINICAL DATA:  Cough for 2 months. EXAM: CHEST - 2 VIEW COMPARISON:  05/31/2021 no FINDINGS: The heart size and mediastinal contours are within normal limits. Both lungs are clear. Pleural effusion or pneumothorax. The visualized skeletal structures are unremarkable. IMPRESSION: No active cardiopulmonary disease. Electronically Signed   By: Lajean Manes M.D.   On: 06/06/2021 09:27    Recent Results (from the past 2160 hour(s))  Resp Panel by RT-PCR (Flu A&B, Covid)  Nasopharyngeal Swab     Status: None   Collection Time: 04/27/21 10:54 PM   Specimen: Nasopharyngeal Swab; Nasopharyngeal(NP) swabs in vial transport medium  Result Value Ref Range   SARS Coronavirus 2 by RT PCR NEGATIVE NEGATIVE    Comment: (NOTE) SARS-CoV-2 target nucleic acids are NOT DETECTED.  The SARS-CoV-2 RNA is generally detectable in upper respiratory specimens during the acute phase of infection. The lowest concentration of SARS-CoV-2 viral copies this assay can detect is 138 copies/mL. A negative result does not preclude SARS-Cov-2 infection and should not be used as the sole basis for treatment or other patient management decisions. A negative result may occur with  improper specimen collection/handling, submission of specimen other than nasopharyngeal swab, presence of viral mutation(s) within the areas targeted by this assay, and inadequate number of viral copies(<138 copies/mL). A negative result must be combined with clinical observations, patient history, and epidemiological information. The expected result is Negative.  Fact Sheet for Patients:  EntrepreneurPulse.com.au  Fact Sheet for Healthcare Providers:  IncredibleEmployment.be  This test is no t yet approved or cleared by the [place] FDA and  has been authorized for detection and/or diagnosis of SARS-CoV-2 by FDA under an Emergency Use Authorization (EUA). This EUA will remain  in effect (meaning this test can be used) for the duration of the COVID-19 declaration under Section 564(b)(1) of the Act, 21 U.S.C.section 360bbb-3(b)(1), unless the authorization is terminated  or revoked sooner.       Influenza A by PCR NEGATIVE NEGATIVE   Influenza B by PCR NEGATIVE NEGATIVE    Comment: (NOTE) The Xpert Xpress SARS-CoV-2/FLU/RSV plus assay is intended as an aid in the diagnosis of influenza from Nasopharyngeal swab specimens and should not be used as a sole basis for  treatment. Nasal washings and aspirates are unacceptable for Xpert Xpress SARS-CoV-2/FLU/RSV testing.  Fact Sheet for Patients: EntrepreneurPulse.com.au  Fact Sheet for Healthcare Providers: IncredibleEmployment.be  This test is not yet approved or cleared by the [place] FDA and has been authorized for detection and/or diagnosis of SARS-CoV-2 by FDA under an Emergency Use Authorization (EUA). This EUA will remain in effect (meaning this test can be used) for the duration of the COVID-19 declaration under Section 564(b)(1) of the Act, 21 U.S.C. section 360bbb-3(b)(1), unless the authorization is terminated or revoked.  Performed at [facility], [address] [place], [place] [address]   CBC with Differential/Platelet     Status: Abnormal   Collection Time: 04/27/21 11:16 PM  Result Value Ref Range   WBC 20.3 (H) 4.0 - 10.5 K/uL   RBC 5.00 3.87 - 5.11 MIL/uL   Hemoglobin 14.0 12.0 - 15.0 g/dL   HCT 43.9 36.0 -  46.0 %   MCV 87.8 80.0 - 100.0 fL   MCH 28.0 26.0 - 34.0 pg   MCHC 31.9 30.0 - 36.0 g/dL   RDW 14.6 11.5 - 15.5 %   Platelets 318 150 - 400 K/uL   nRBC 0.0 0.0 - 0.2 %   Neutrophils Relative % 78 %   Neutro Abs 15.9 (H) 1.7 - 7.7 K/uL   Lymphocytes Relative 14 %   Lymphs Abs 2.8 0.7 - 4.0 K/uL   Monocytes Relative 7 %   Monocytes Absolute 1.5 (H) 0.1 - 1.0 K/uL   Eosinophils Relative 0 %   Eosinophils Absolute 0.1 0.0 - 0.5 K/uL   Basophils Relative 0 %   Basophils Absolute 0.0 0.0 - 0.1 K/uL   Immature Granulocytes 1 %   Abs Immature Granulocytes 0.10 (H) 0.00 - 0.07 K/uL    Comment: Performed at [facility], [address] [place], [place] [id number]  Basic metabolic panel     Status: Abnormal   Collection Time: 04/27/21 11:16 PM  Result Value Ref Range   Sodium 135 135 - 145 mmol/L   Potassium 3.9 3.5 - 5.1 mmol/L   Chloride 101 98 - 111 mmol/L   CO2 25 22 - 32 mmol/L   Glucose, Bld 154 (H) 70 - 99 mg/dL     Comment: Glucose reference range applies only to samples taken after fasting for at least 8 hours.   BUN 10 6 - 20 mg/dL   Creatinine, Ser 0.65 0.44 - 1.00 mg/dL   Calcium 9.4 8.9 - 10.3 mg/dL   GFR, Estimated >60 >60 mL/min    Comment: (NOTE) Calculated using the CKD-EPI Creatinine Equation (2021)    Anion gap 9 5 - 15    Comment: Performed at [facility], [address], [place], [place] [address]  D-dimer, quantitative     Status: None   Collection Time: 04/27/21 11:16 PM  Result Value Ref Range   D-Dimer, Quant 0.39 0.00 - 0.50 ug/mL-FEU    Comment: (NOTE) At the manufacturer cut-off value of 0.5 g/mL FEU, this assay has a negative predictive value of 95-100%.This assay is intended for use in conjunction with a clinical pretest probability (PTP) assessment model to exclude pulmonary embolism (PE) and deep venous thrombosis (DVT) in outpatients suspected of PE or DVT. Results should be correlated with clinical presentation. Performed at [facility], [address] [place], [place] [address]   hCG, serum, qualitative     Status: None   Collection Time: 04/27/21 11:16 PM  Result Value Ref Range   Preg, Serum NEGATIVE NEGATIVE    Comment:        THE SENSITIVITY OF THIS METHODOLOGY IS >10 mIU/mL. Performed at [facility], [address], [place], [place] [id number]   Basic metabolic panel     Status: Abnormal   Collection Time: 04/28/21  2:40 PM  Result Value Ref Range   Sodium 133 (L) 135 - 145 mmol/L   Potassium 4.3 3.5 - 5.1 mmol/L   Chloride 100 98 - 111 mmol/L   CO2 23 22 - 32 mmol/L   Glucose, Bld 170 (H) 70 - 99 mg/dL    Comment: Glucose reference range applies only to samples taken after fasting for at least 8 hours.   BUN 9 6 - 20 mg/dL   Creatinine, Ser 0.68 0.44 - 1.00 mg/dL   Calcium 9.2 8.9 - 10.3 mg/dL   GFR, Estimated >60 >60 mL/min    Comment: (NOTE) Calculated using the  CKD-EPI Creatinine Equation (2021)    Anion gap 10 5 - 15    Comment: Performed at  [facility], [address], [place], [place] [address]  CBC     Status: Abnormal   Collection Time: 04/28/21  2:40 PM  Result Value Ref Range   WBC 19.8 (H) 4.0 - 10.5 K/uL   RBC 5.48 (H) 3.87 - 5.11 MIL/uL   Hemoglobin 14.9 12.0 - 15.0 g/dL   HCT 47.4 (H) 36.0 - 46.0 %   MCV 86.5 80.0 - 100.0 fL   MCH 27.2 26.0 - 34.0 pg   MCHC 31.4 30.0 - 36.0 g/dL   RDW 14.6 11.5 - 15.5 %   Platelets 321 150 - 400 K/uL   nRBC 0.0 0.0 - 0.2 %    Comment: Performed at [facility], [address], [place], [place] [address]  Troponin I (High Sensitivity)     Status: Abnormal   Collection Time: 04/28/21  2:40 PM  Result Value Ref Range   Troponin I (High Sensitivity) 25 (H) <18 ng/L    Comment: (NOTE) Elevated high sensitivity troponin I (hsTnI) values and significant  changes across serial measurements may suggest ACS but many other  chronic and acute conditions are known to elevate hsTnI results.  Refer to the "Links" section for chest pain algorithms and additional  guidance. Performed at [facility], [address], [place], [place] [address]   Blood culture (routine x 2)     Status: Abnormal   Collection Time: 04/28/21  4:59 PM   Specimen: BLOOD RIGHT HAND  Result Value Ref Range   Specimen Description      BLOOD RIGHT HAND Performed at [facility], [address] [place], [place] [address]    Special Requests      BOTTLES DRAWN AEROBIC ONLY Blood Culture results may not be optimal due to an inadequate volume of blood received in culture bottles Performed at [address] [place], [place] [address]    Culture  Setup Time      GRAM POSITIVE COCCI AEROBIC BOTTLE Gram Stain Report Called to,Read Back By and Verified With: WAGNER,[email or web address]  BY MATTHEWS, B 10.19.22 GRAM POSITIVE RODS CRITICAL RESULT CALLED TO, READ BACK BY AND VERIFIED WITH: PHARMD S.HALL AT 1000 ON 04/30/2021 BY T.SAAD.    Culture (A)     STAPHYLOCOCCUS EPIDERMIDIS THE SIGNIFICANCE OF ISOLATING THIS  ORGANISM FROM A SINGLE SET OF BLOOD CULTURES WHEN MULTIPLE SETS ARE DRAWN IS UNCERTAIN. PLEASE NOTIFY THE MICROBIOLOGY DEPARTMENT WITHIN ONE WEEK IF SPECIATION AND SENSITIVITIES ARE REQUIRED. DIPHTHEROIDS(CORYNEBACTERIUM SPECIES) MICROCOCCUS SPECIES Standardized susceptibility testing for this organism is not available. Performed at [facility], [place] [address] [place], [place] [address]    Report Status 05/03/2021 FINAL   Blood culture (routine x 2)     Status: None   Collection Time: 04/28/21  4:59 PM   Specimen: BLOOD LEFT HAND  Result Value Ref Range   Specimen Description BLOOD LEFT HAND    Special Requests      BOTTLES DRAWN AEROBIC ONLY Blood Culture results may not be optimal due to an inadequate volume of blood received in culture bottles   Culture      NO GROWTH 5 DAYS Performed at [facility], [address] [place], [place] [address]    Report Status 05/03/2021 FINAL   Blood Culture ID Panel (Reflexed)     Status: Abnormal   Collection Time: 04/28/21  4:59 PM  Result Value Ref Range  Enterococcus faecalis NOT DETECTED NOT DETECTED   Enterococcus Faecium NOT DETECTED NOT DETECTED   Listeria monocytogenes NOT DETECTED NOT DETECTED   Staphylococcus species DETECTED (A) NOT DETECTED    Comment: CRITICAL RESULT CALLED TO, READ BACK BY AND VERIFIED WITH: PHARMD S.HALL AT 1000 ON 04/30/2021 BY T.SAAD.    Staphylococcus aureus (BCID) NOT DETECTED NOT DETECTED   Staphylococcus epidermidis DETECTED (A) NOT DETECTED    Comment: CRITICAL RESULT CALLED TO, READ BACK BY AND VERIFIED WITH: PHARMD S.HALL AT 1000 ON 04/30/2021 BY T.SAAD.    Staphylococcus lugdunensis NOT DETECTED NOT DETECTED   Streptococcus species NOT DETECTED NOT DETECTED   Streptococcus agalactiae NOT DETECTED NOT DETECTED   Streptococcus pneumoniae NOT DETECTED NOT DETECTED   Streptococcus pyogenes NOT DETECTED NOT DETECTED   A.calcoaceticus-baumannii NOT DETECTED NOT DETECTED   Bacteroides fragilis  NOT DETECTED NOT DETECTED   Enterobacterales NOT DETECTED NOT DETECTED   Enterobacter cloacae complex NOT DETECTED NOT DETECTED   Escherichia coli NOT DETECTED NOT DETECTED   Klebsiella aerogenes NOT DETECTED NOT DETECTED   Klebsiella oxytoca NOT DETECTED NOT DETECTED   Klebsiella pneumoniae NOT DETECTED NOT DETECTED   Proteus species NOT DETECTED NOT DETECTED   Salmonella species NOT DETECTED NOT DETECTED   Serratia marcescens NOT DETECTED NOT DETECTED   Haemophilus influenzae NOT DETECTED NOT DETECTED   Neisseria meningitidis NOT DETECTED NOT DETECTED   Pseudomonas aeruginosa NOT DETECTED NOT DETECTED   Stenotrophomonas maltophilia NOT DETECTED NOT DETECTED   Candida albicans NOT DETECTED NOT DETECTED   Candida auris NOT DETECTED NOT DETECTED   Candida glabrata NOT DETECTED NOT DETECTED   Candida krusei NOT DETECTED NOT DETECTED   Candida parapsilosis NOT DETECTED NOT DETECTED   Candida tropicalis NOT DETECTED NOT DETECTED   Cryptococcus neoformans/gattii NOT DETECTED NOT DETECTED   Methicillin resistance mecA/C NOT DETECTED NOT DETECTED    Comment: Performed at [facility], [address] [place], [place] [address]  Troponin I (High Sensitivity)     Status: Abnormal   Collection Time: 04/28/21  5:20 PM  Result Value Ref Range   Troponin I (High Sensitivity) 22 (H) <18 ng/L    Comment: (NOTE) Elevated high sensitivity troponin I (hsTnI) values and significant  changes across serial measurements may suggest ACS but many other  chronic and acute conditions are known to elevate hsTnI results.  Refer to the "Links" section for chest pain algorithms and additional  guidance. Performed at [facility], [address] [place], [place] [address]   Lactic acid, plasma     Status: Abnormal   Collection Time: 04/28/21  5:20 PM  Result Value Ref Range   Lactic Acid, Venous 2.0 (HH) 0.5 - 1.9 mmol/L    Comment: CRITICAL RESULT CALLED TO, READ BACK BY AND VERIFIED WITH: LONG,L ON  04/28/21 AT 1845 BY LOY,C Performed at [facility], [address] [place], [place] [address]   TSH     Status: None   Collection Time: 04/28/21  5:20 PM  Result Value Ref Range   TSH 0.466 0.350 - 4.500 uIU/mL    Comment: Performed by a 3rd Generation assay with a functional sensitivity of <=0.01 uIU/mL. Performed at [facility], [address] [place], [place] [address]   Magnesium     Status: None   Collection Time: 04/28/21  5:20 PM  Result Value Ref Range   Magnesium 1.9 1.7 - 2.4 mg/dL    Comment: Performed at [facility], [address] [place], [place]  [id number]  Brain natriuretic peptide     Status: None   Collection Time: 04/28/21  5:20 PM  Result Value Ref Range   B Natriuretic Peptide 45.0 0.0 - 100.0 pg/mL    Comment: Performed at [facility], [address] [place], [place] [address]  Urinalysis, Routine w reflex microscopic Urine, Clean Catch     Status: Abnormal   Collection Time: 04/28/21  9:30 PM  Result Value Ref Range   Color, Urine YELLOW YELLOW   APPearance CLEAR CLEAR   Specific Gravity, Urine 1.024 1.005 - 1.030   pH 6.0 5.0 - 8.0   Glucose, UA 50 (A) NEGATIVE mg/dL   Hgb urine dipstick NEGATIVE NEGATIVE   Bilirubin Urine NEGATIVE NEGATIVE   Ketones, ur NEGATIVE NEGATIVE mg/dL   Protein, ur NEGATIVE NEGATIVE mg/dL   Nitrite NEGATIVE NEGATIVE   Leukocytes,Ua NEGATIVE NEGATIVE    Comment: Performed at [facility], [address] [place], [place] [address]  Rapid urine drug screen (hospital performed)     Status: None   Collection Time: 04/28/21  9:30 PM  Result Value Ref Range   Opiates NONE DETECTED NONE DETECTED   Cocaine NONE DETECTED NONE DETECTED   Benzodiazepines NONE DETECTED NONE DETECTED   Amphetamines NONE DETECTED NONE DETECTED   Tetrahydrocannabinol NONE DETECTED NONE DETECTED   Barbiturates NONE DETECTED NONE DETECTED    Comment: (NOTE) DRUG SCREEN FOR MEDICAL PURPOSES ONLY.  IF CONFIRMATION IS NEEDED FOR ANY PURPOSE, NOTIFY  LAB WITHIN 5 DAYS.  LOWEST DETECTABLE LIMITS FOR URINE DRUG SCREEN Drug Class                     Cutoff (ng/mL) Amphetamine and metabolites    1000 Barbiturate and metabolites    200 Benzodiazepine                 [id number] Tricyclics and metabolites     300 Opiates and metabolites        300 Cocaine and metabolites        300 THC                            50 Performed at [facility], [address] [place], [place] [address]   POC urine preg, ED     Status: None   Collection Time: 04/28/21  9:32 PM  Result Value Ref Range   Preg Test, Ur NEGATIVE NEGATIVE    Comment:        THE SENSITIVITY OF THIS METHODOLOGY IS >24 mIU/mL   MRSA Next Gen by PCR, Nasal     Status: None   Collection Time: 04/28/21 11:30 PM   Specimen: Nasal Mucosa; Nasal Swab  Result Value Ref Range   MRSA by PCR Next Gen NOT DETECTED NOT DETECTED    Comment: (NOTE) The GeneXpert MRSA Assay (FDA approved for NASAL specimens only), is one component of a comprehensive MRSA colonization surveillance program. It is not intended to diagnose MRSA infection nor to guide or monitor treatment for MRSA infections. Test performance is not FDA approved in patients less than 61 years old. Performed at [facility], [address] [place], [place] [address]   HIV Antibody (routine testing w rflx)     Status: None   Collection Time: 04/29/21  5:40 AM  Result Value Ref Range   HIV Screen 4th Generation wRfx Non Reactive Non Reactive    Comment: Performed at [facility],  [address], [place], [place] [id number]  Basic metabolic panel     Status: Abnormal   Collection Time: 04/29/21  5:40 AM  Result Value Ref Range   Sodium 137 135 - 145 mmol/L   Potassium 3.8 3.5 - 5.1 mmol/L   Chloride 105 98 - 111 mmol/L   CO2 26 22 - 32 mmol/L   Glucose, Bld 112 (H) 70 - 99 mg/dL    Comment: Glucose reference range applies only to samples taken after fasting for at least 8 hours.   BUN 8 6 - 20 mg/dL   Creatinine, Ser 0.58  0.44 - 1.00 mg/dL   Calcium 8.5 (L) 8.9 - 10.3 mg/dL   GFR, Estimated >60 >60 mL/min    Comment: (NOTE) Calculated using the CKD-EPI Creatinine Equation (2021)    Anion gap 6 5 - 15    Comment: Performed at [facility], [address] [place], [place] [address]  CBC     Status: Abnormal   Collection Time: 04/29/21  5:40 AM  Result Value Ref Range   WBC 14.8 (H) 4.0 - 10.5 K/uL   RBC 4.84 3.87 - 5.11 MIL/uL   Hemoglobin 13.3 12.0 - 15.0 g/dL   HCT 42.3 36.0 - 46.0 %   MCV 87.4 80.0 - 100.0 fL   MCH 27.5 26.0 - 34.0 pg   MCHC 31.4 30.0 - 36.0 g/dL   RDW 14.9 11.5 - 15.5 %   Platelets 284 150 - 400 K/uL   nRBC 0.0 0.0 - 0.2 %    Comment: Performed at [facility], [address] [place], [place] [address]  Procalcitonin - Baseline     Status: None   Collection Time: 04/29/21  5:40 AM  Result Value Ref Range   Procalcitonin <0.10 ng/mL    Comment:        Interpretation: PCT (Procalcitonin) <= 0.5 ng/mL: Systemic infection (sepsis) is not likely. Local bacterial infection is possible. (NOTE)       Sepsis PCT Algorithm           Lower Respiratory Tract                                      Infection PCT Algorithm    ----------------------------     ----------------------------         PCT < 0.25 ng/mL                PCT < 0.10 ng/mL          Strongly encourage             Strongly discourage   discontinuation of antibiotics    initiation of antibiotics    ----------------------------     -----------------------------       PCT 0.25 - 0.50 ng/mL            PCT 0.10 - 0.25 ng/mL               OR       >80% decrease in PCT            Discourage initiation of                                            antibiotics      Encourage discontinuation  of antibiotics    ----------------------------     -----------------------------         PCT >= 0.50 ng/mL              PCT 0.26 - 0.50 ng/mL               AND        <80% decrease in PCT             Encourage initiation of                                              antibiotics       Encourage continuation           of antibiotics    ----------------------------     -----------------------------        PCT >= 0.50 ng/mL                  PCT > 0.50 ng/mL               AND         increase in PCT                  Strongly encourage                                      initiation of antibiotics    Strongly encourage escalation           of antibiotics                                     -----------------------------                                           PCT <= 0.25 ng/mL                                                 OR                                        > 80% decrease in PCT                                      Discontinue / Do not initiate                                             antibiotics  Performed at [facility], [address], [place], [place] [address]   Sedimentation rate     Status: Abnormal   Collection Time: 04/29/21  5:40 AM  Result Value Ref Range   Sed Rate 32 (H) 0 - 22 mm/hr  Comment: Performed at [facility], [address] [place], [place] [address]  C-reactive protein     Status: None   Collection Time: 04/29/21  5:40 AM  Result Value Ref Range   CRP 0.8 <1.0 mg/dL    Comment: Performed at [facility], [address] [place], [place] [address]  VITAMIN D 25 Hydroxy (Vit-D Deficiency, Fractures)     Status: Abnormal   Collection Time: 04/29/21  5:40 AM  Result Value Ref Range   Vit D, 25-Hydroxy 13.42 (L) 30 - 100 ng/mL    Comment: (NOTE) Vitamin D deficiency has been defined by the [place] practice guideline as a level of serum 25-OH  vitamin D less than 20 ng/mL (1,2). The Endocrine Society went on to  further define vitamin D insufficiency as a level between 21 and 29  ng/mL (2).  1. IOM (Institute of Medicine). 2010. Dietary reference intakes for  calcium and D. [place]: The [employer]. 2. Holick MF,  Binkley Goodwin, Bischoff-Ferrari HA, et al. Evaluation,  treatment, and prevention of vitamin D deficiency: an Endocrine  Society clinical practice guideline, JCEM. 2011 Jul; 96(7): 1911-30.  Performed at [facility], [place] [address], [place], [place] [address]   Urine rapid drug screen (hosp performed)     Status: None   Collection Time: 04/29/21  8:30 AM  Result Value Ref Range   Opiates NONE DETECTED NONE DETECTED   Cocaine NONE DETECTED NONE DETECTED   Benzodiazepines NONE DETECTED NONE DETECTED   Amphetamines NONE DETECTED NONE DETECTED   Tetrahydrocannabinol NONE DETECTED NONE DETECTED   Barbiturates NONE DETECTED NONE DETECTED    Comment: (NOTE) DRUG SCREEN FOR MEDICAL PURPOSES ONLY.  IF CONFIRMATION IS NEEDED FOR ANY PURPOSE, NOTIFY LAB WITHIN 5 DAYS.  LOWEST DETECTABLE LIMITS FOR URINE DRUG SCREEN Drug Class                     Cutoff (ng/mL) Amphetamine and metabolites    1000 Barbiturate and metabolites    200 Benzodiazepine                 [id number] Tricyclics and metabolites     300 Opiates and metabolites        300 Cocaine and metabolites        300 THC                            50 Performed at [facility] [place], [place] [address]   Urinalysis, Complete w Microscopic Urine, Clean Catch     Status: Abnormal   Collection Time: 04/29/21  8:30 AM  Result Value Ref Range   Color, Urine YELLOW YELLOW   APPearance CLEAR CLEAR   Specific Gravity, Urine 1.010 1.005 - 1.030   pH 6.5 5.0 - 8.0   Glucose, UA NEGATIVE NEGATIVE mg/dL   Hgb urine dipstick TRACE (A) NEGATIVE   Bilirubin Urine NEGATIVE NEGATIVE   Ketones, ur NEGATIVE NEGATIVE mg/dL   Protein, ur NEGATIVE NEGATIVE mg/dL   Nitrite NEGATIVE NEGATIVE   Leukocytes,Ua TRACE (A) NEGATIVE   Squamous Epithelial / LPF 0-5 0 - 5   WBC, UA 6-10 0 - 5 WBC/hpf   RBC / HPF 0-5 0 - 5 RBC/hpf   Bacteria, UA RARE (A) NONE SEEN    Comment: Performed at [facility], [address] [place],   [address]  ECHOCARDIOGRAM COMPLETE     Status: None   Collection Time: 04/29/21  4:12 PM  Result Value Ref Range   Weight 3,636.71 oz   Height 63 in   BP 133/67 mmHg   Area-P 1/2 6.65 cm2   S' Lateral 2.60 cm  Lactic acid, plasma     Status: None   Collection Time: 04/29/21  5:23 PM  Result Value Ref Range   Lactic Acid, Venous 1.8 0.5 - 1.9 mmol/L    Comment: Performed at [facility], [address], [place], [place] [address]  Hemoglobin A1c     Status: Abnormal   Collection Time: 04/29/21  5:23 PM  Result Value Ref Range   Hgb A1c MFr Bld 5.8 (H) 4.8 - 5.6 %    Comment: (NOTE) Pre diabetes:          5.7%-6.4%  Diabetes:              >6.4%  Glycemic control for   <7.0% adults with diabetes    Mean Plasma Glucose 119.76 mg/dL    Comment: Performed at [facility] [address] [place], [place] [address]  Culture, blood (routine x 2)     Status: None   Collection Time: 04/30/21  5:11 AM   Specimen: BLOOD LEFT ARM  Result Value Ref Range   Specimen Description BLOOD LEFT ARM    Special Requests      Blood Culture adequate volume BOTTLES DRAWN AEROBIC AND ANAEROBIC   Culture      NO GROWTH 5 DAYS Performed at [facility], [address] [place], [place] [address]    Report Status 05/05/2021 FINAL   Culture, blood (routine x 2)     Status: None   Collection Time: 04/30/21  5:12 AM   Specimen: BLOOD RIGHT ARM  Result Value Ref Range   Specimen Description BLOOD RIGHT ARM    Special Requests      Blood Culture adequate volume BOTTLES DRAWN AEROBIC ONLY   Culture      NO GROWTH 5 DAYS Performed at [facility], [address], [place], [place] [address]    Report Status 05/05/2021 FINAL   Procalcitonin     Status: None   Collection Time: 04/30/21  5:13 AM  Result Value Ref Range   Procalcitonin <0.10 ng/mL    Comment:        Interpretation: PCT (Procalcitonin) <= 0.5 ng/mL: Systemic infection (sepsis) is not likely. Local bacterial infection is  possible. (NOTE)       Sepsis PCT Algorithm           Lower Respiratory Tract                                      Infection PCT Algorithm    ----------------------------     ----------------------------         PCT < 0.25 ng/mL                PCT < 0.10 ng/mL          Strongly encourage             Strongly discourage   discontinuation of antibiotics    initiation of antibiotics    ----------------------------     -----------------------------       PCT 0.25 - 0.50 ng/mL            PCT 0.10 -  0.25 ng/mL               OR       >80% decrease in PCT            Discourage initiation of                                            antibiotics      Encourage discontinuation           of antibiotics    ----------------------------     -----------------------------         PCT >= 0.50 ng/mL              PCT 0.26 - 0.50 ng/mL               AND        <80% decrease in PCT             Encourage initiation of                                             antibiotics       Encourage continuation           of antibiotics    ----------------------------     -----------------------------        PCT >= 0.50 ng/mL                  PCT > 0.50 ng/mL               AND         increase in PCT                  Strongly encourage                                      initiation of antibiotics    Strongly encourage escalation           of antibiotics                                     -----------------------------                                           PCT <= 0.25 ng/mL                                                 OR                                        > 80% decrease in PCT  Discontinue / Do not initiate                                             antibiotics  Performed at [facility], [address] [place], [place] [id number]   Basic metabolic panel     Status: Abnormal   Collection Time: 04/30/21  5:13 AM  Result Value Ref Range   Sodium 137 135 - 145  mmol/L   Potassium 3.9 3.5 - 5.1 mmol/L   Chloride 101 98 - 111 mmol/L   CO2 28 22 - 32 mmol/L   Glucose, Bld 111 (H) 70 - 99 mg/dL    Comment: Glucose reference range applies only to samples taken after fasting for at least 8 hours.   BUN 9 6 - 20 mg/dL   Creatinine, Ser 0.70 0.44 - 1.00 mg/dL   Calcium 8.5 (L) 8.9 - 10.3 mg/dL   GFR, Estimated >60 >60 mL/min    Comment: (NOTE) Calculated using the CKD-EPI Creatinine Equation (2021)    Anion gap 8 5 - 15    Comment: Performed at [facility], [address] [place], [place] [address]  Magnesium     Status: None   Collection Time: 04/30/21  5:13 AM  Result Value Ref Range   Magnesium 2.1 1.7 - 2.4 mg/dL    Comment: Performed at [facility], [address] [place], [place] [address]  CBC     Status: None   Collection Time: 05/21/21  6:17 PM  Result Value Ref Range   WBC 10.2 4.0 - 10.5 K/uL   RBC 5.05 3.87 - 5.11 MIL/uL   Hemoglobin 13.8 12.0 - 15.0 g/dL   HCT 43.5 36.0 - 46.0 %   MCV 86.1 80.0 - 100.0 fL   MCH 27.3 26.0 - 34.0 pg   MCHC 31.7 30.0 - 36.0 g/dL   RDW 14.0 11.5 - 15.5 %   Platelets 273 150 - 400 K/uL   nRBC 0.0 0.0 - 0.2 %    Comment: Performed at [facility], [address] [place], [place] [address]  Comprehensive metabolic panel     Status: Abnormal   Collection Time: 05/21/21  6:17 PM  Result Value Ref Range   Sodium 135 135 - 145 mmol/L   Potassium 3.6 3.5 - 5.1 mmol/L   Chloride 99 98 - 111 mmol/L   CO2 25 22 - 32 mmol/L   Glucose, Bld 160 (H) 70 - 99 mg/dL    Comment: Glucose reference range applies only to samples taken after fasting for at least 8 hours.   BUN 9 6 - 20 mg/dL   Creatinine, Ser 0.80 0.44 - 1.00 mg/dL   Calcium 9.2 8.9 - 10.3 mg/dL   Total Protein 8.0 6.5 - 8.1 g/dL   Albumin 4.3 3.5 - 5.0 g/dL   AST 22 15 - 41 U/L   ALT 29 0 - 44 U/L   Alkaline Phosphatase 91 38 - 126 U/L   Total Bilirubin 0.5 0.3 - 1.2 mg/dL   GFR, Estimated >60 >60 mL/min    Comment: (NOTE) Calculated  using the CKD-EPI Creatinine Equation (2021)    Anion gap 11 5 - 15    Comment: Performed at [facility], [address], [place],  [address]  Lipase, blood     Status: None   Collection Time: 05/21/21  6:17 PM  Result  Value Ref Range   Lipase 20 11 - 51 U/L    Comment: Performed at [facility], [address] [place], [place] [address]  Resp Panel by RT-PCR (Flu A&B, Covid) Nasopharyngeal Swab     Status: Abnormal   Collection Time: 05/21/21  6:31 PM   Specimen: Nasopharyngeal Swab; Nasopharyngeal(NP) swabs in vial transport medium  Result Value Ref Range   SARS Coronavirus 2 by RT PCR NEGATIVE NEGATIVE    Comment: (NOTE) SARS-CoV-2 target nucleic acids are NOT DETECTED.  The SARS-CoV-2 RNA is generally detectable in upper respiratory specimens during the acute phase of infection. The lowest concentration of SARS-CoV-2 viral copies this assay can detect is 138 copies/mL. A negative result does not preclude SARS-Cov-2 infection and should not be used as the sole basis for treatment or other patient management decisions. A negative result may occur with  improper specimen collection/handling, submission of specimen other than nasopharyngeal swab, presence of viral mutation(s) within the areas targeted by this assay, and inadequate number of viral copies(<138 copies/mL). A negative result must be combined with clinical observations, patient history, and epidemiological information. The expected result is Negative.  Fact Sheet for Patients:  EntrepreneurPulse.com.au  Fact Sheet for Healthcare Providers:  IncredibleEmployment.be  This test is no t yet approved or cleared by the [place] FDA and  has been authorized for detection and/or diagnosis of SARS-CoV-2 by FDA under an Emergency Use Authorization (EUA). This EUA will remain  in effect (meaning this test can be used) for the duration of the COVID-19 declaration under Section  564(b)(1) of the Act, 21 U.S.C.section 360bbb-3(b)(1), unless the authorization is terminated  or revoked sooner.       Influenza A by PCR POSITIVE (A) NEGATIVE   Influenza B by PCR NEGATIVE NEGATIVE    Comment: (NOTE) The Xpert Xpress SARS-CoV-2/FLU/RSV plus assay is intended as an aid in the diagnosis of influenza from Nasopharyngeal swab specimens and should not be used as a sole basis for treatment. Nasal washings and aspirates are unacceptable for Xpert Xpress SARS-CoV-2/FLU/RSV testing.  Fact Sheet for Patients: EntrepreneurPulse.com.au  Fact Sheet for Healthcare Providers: IncredibleEmployment.be  This test is not yet approved or cleared by the [place] FDA and has been authorized for detection and/or diagnosis of SARS-CoV-2 by FDA under an Emergency Use Authorization (EUA). This EUA will remain in effect (meaning this test can be used) for the duration of the COVID-19 declaration under Section 564(b)(1) of the Act, 21 U.S.C. section 360bbb-3(b)(1), unless the authorization is terminated or revoked.  Performed at [facility], [address] [place],  [address]   Urinalysis, Routine w reflex microscopic Urine, Clean Catch     Status: Abnormal   Collection Time: 05/21/21  8:05 PM  Result Value Ref Range   Color, Urine YELLOW YELLOW   APPearance HAZY (A) CLEAR   Specific Gravity, Urine 1.029 1.005 - 1.030   pH 5.0 5.0 - 8.0   Glucose, UA NEGATIVE NEGATIVE mg/dL   Hgb urine dipstick NEGATIVE NEGATIVE   Bilirubin Urine NEGATIVE NEGATIVE   Ketones, ur 5 (A) NEGATIVE mg/dL   Protein, ur 30 (A) NEGATIVE mg/dL   Nitrite NEGATIVE NEGATIVE   Leukocytes,Ua MODERATE (A) NEGATIVE   RBC / HPF 6-10 0 - 5 RBC/hpf   WBC, UA 11-20 0 - 5 WBC/hpf   Bacteria, UA NONE SEEN NONE SEEN   Squamous Epithelial / LPF 0-5 0 - 5   Mucus PRESENT     Comment: Performed at [facility], [address] [place], [place] [address]  Rapid urine drug  screen (hospital performed)     Status: None   Collection Time: 05/21/21  8:05 PM  Result Value Ref Range   Opiates NONE DETECTED NONE DETECTED   Cocaine NONE DETECTED NONE DETECTED   Benzodiazepines NONE DETECTED NONE DETECTED   Amphetamines NONE DETECTED NONE DETECTED   Tetrahydrocannabinol NONE DETECTED NONE DETECTED   Barbiturates NONE DETECTED NONE DETECTED    Comment: (NOTE) DRUG SCREEN FOR MEDICAL PURPOSES ONLY.  IF CONFIRMATION IS NEEDED FOR ANY PURPOSE, NOTIFY LAB WITHIN 5 DAYS.  LOWEST DETECTABLE LIMITS FOR URINE DRUG SCREEN Drug Class                     Cutoff (ng/mL) Amphetamine and metabolites    1000 Barbiturate and metabolites    200 Benzodiazepine                 A999333 Tricyclics and metabolites     300 Opiates and metabolites        300 Cocaine and metabolites        300 THC                            50 Performed at [facility], [address] [place],  [address]   Comprehensive metabolic panel     Status: Abnormal   Collection Time: 05/31/21  9:24 PM  Result Value Ref Range   Sodium 135 135 - 145 mmol/L   Potassium 3.9 3.5 - 5.1 mmol/L   Chloride 99 98 - 111 mmol/L   CO2 27 22 - 32 mmol/L   Glucose, Bld 125 (H) 70 - 99 mg/dL    Comment: Glucose reference range applies only to samples taken after fasting for at least 8 hours.   BUN 7 6 - 20 mg/dL   Creatinine, Ser 0.69 0.44 - 1.00 mg/dL   Calcium 10.0 8.9 - 10.3 mg/dL   Total Protein 8.5 (H) 6.5 - 8.1 g/dL   Albumin 4.3 3.5 - 5.0 g/dL   AST 26 15 - 41 U/L   ALT 48 (H) 0 - 44 U/L   Alkaline Phosphatase 78 38 - 126 U/L   Total Bilirubin 0.4 0.3 - 1.2 mg/dL   GFR, Estimated >60 >60 mL/min    Comment: (NOTE) Calculated using the CKD-EPI Creatinine Equation (2021)    Anion gap 9 5 - 15    Comment: Performed at [facility], [place], [place] [address]  CBC with Differential     Status: Abnormal   Collection Time: 05/31/21  9:24 PM  Result Value Ref Range    WBC 15.3 (H) 4.0 - 10.5 K/uL   RBC 4.99 3.87 - 5.11 MIL/uL   Hemoglobin 13.2 12.0 - 15.0 g/dL   HCT 41.7 36.0 - 46.0 %   MCV 83.6 80.0 - 100.0 fL   MCH 26.5 26.0 - 34.0 pg   MCHC 31.7 30.0 - 36.0 g/dL   RDW 13.8 11.5 - 15.5 %   Platelets 369 150 - 400 K/uL   nRBC 0.0 0.0 - 0.2 %   Neutrophils Relative % 63 %   Neutro Abs 9.9 (H) 1.7 - 7.7 K/uL   Lymphocytes Relative 24 %   Lymphs Abs 3.6 0.7 - 4.0 K/uL   Monocytes Relative 11 %   Monocytes Absolute 1.6 (H) 0.1 - 1.0 K/uL   Eosinophils Relative 1 %  Eosinophils Absolute 0.1 0.0 - 0.5 K/uL   Basophils Relative 0 %   Basophils Absolute 0.0 0.0 - 0.1 K/uL   Immature Granulocytes 1 %   Abs Immature Granulocytes 0.07 0.00 - 0.07 K/uL    Comment: Performed at [facility], [address] [place], [place] [address]  Resp Panel by RT-PCR (Flu A&B, Covid) Nasopharyngeal Swab     Status: None   Collection Time: 05/31/21  9:24 PM   Specimen: Nasopharyngeal Swab; Nasopharyngeal(NP) swabs in vial transport medium  Result Value Ref Range   SARS Coronavirus 2 by RT PCR NEGATIVE NEGATIVE    Comment: (NOTE) SARS-CoV-2 target nucleic acids are NOT DETECTED.  The SARS-CoV-2 RNA is generally detectable in upper respiratory specimens during the acute phase of infection. The lowest concentration of SARS-CoV-2 viral copies this assay can detect is 138 copies/mL. A negative result does not preclude SARS-Cov-2 infection and should not be used as the sole basis for treatment or other patient management decisions. A negative result may occur with  improper specimen collection/handling, submission of specimen other than nasopharyngeal swab, presence of viral mutation(s) within the areas targeted by this assay, and inadequate number of viral copies(<138 copies/mL). A negative result must be combined with clinical observations, patient history, and epidemiological information. The expected result is Negative.  Fact Sheet for  Patients:  EntrepreneurPulse.com.au  Fact Sheet for Healthcare Providers:  IncredibleEmployment.be  This test is no t yet approved or cleared by the [place] FDA and  has been authorized for detection and/or diagnosis of SARS-CoV-2 by FDA under an Emergency Use Authorization (EUA). This EUA will remain  in effect (meaning this test can be used) for the duration of the COVID-19 declaration under Section 564(b)(1) of the Act, 21 U.S.C.section 360bbb-3(b)(1), unless the authorization is terminated  or revoked sooner.       Influenza A by PCR NEGATIVE NEGATIVE   Influenza B by PCR NEGATIVE NEGATIVE    Comment: (NOTE) The Xpert Xpress SARS-CoV-2/FLU/RSV plus assay is intended as an aid in the diagnosis of influenza from Nasopharyngeal swab specimens and should not be used as a sole basis for treatment. Nasal washings and aspirates are unacceptable for Xpert Xpress SARS-CoV-2/FLU/RSV testing.  Fact Sheet for Patients: EntrepreneurPulse.com.au  Fact Sheet for Healthcare Providers: IncredibleEmployment.be  This test is not yet approved or cleared by the [place] FDA and has been authorized for detection and/or diagnosis of SARS-CoV-2 by FDA under an Emergency Use Authorization (EUA). This EUA will remain in effect (meaning this test can be used) for the duration of the COVID-19 declaration under Section 564(b)(1) of the Act, 21 U.S.C. section 360bbb-3(b)(1), unless the authorization is terminated or revoked.  Performed at [facility], [address] [place], [place] [address]   Group A Strep by PCR     Status: None   Collection Time: 05/31/21  9:24 PM   Specimen: Nasopharyngeal Swab; Sterile Swab  Result Value Ref Range   Group A Strep by PCR NOT DETECTED NOT DETECTED    Comment: Performed at [facility], [address], [place], [place] [address]  TSH      Status: None   Collection Time: 05/31/21 11:23 PM  Result Value Ref Range   TSH 1.287 0.350 - 4.500 uIU/mL    Comment: Performed by a 3rd Generation assay with a functional sensitivity of <=0.01 uIU/mL. Performed at [facility], [place] [address] [place],  [address]     DG Chest 1  View  Result Date: 05/31/2021 CLINICAL DATA:  Cough EXAM: CHEST  1 VIEW COMPARISON:  05/26/2021 FINDINGS: Cardiac shadow is within normal limits. Lungs are well aerated bilaterally. No focal infiltrate or effusion is seen. No bony abnormality is noted. IMPRESSION: No active disease. Electronically Signed   By: Inez Catalina M.D.   On: 05/31/2021 21:35   DG Chest 2 View  Result Date: 06/06/2021 CLINICAL DATA:  Cough for 2 months. EXAM: CHEST - 2 VIEW COMPARISON:  05/31/2021 no FINDINGS: The heart size and mediastinal contours are within normal limits. Both lungs are clear. Pleural effusion or pneumothorax. The visualized skeletal structures are unremarkable. IMPRESSION: No active cardiopulmonary disease. Electronically Signed   By: Lajean Manes M.D.   On: 06/06/2021 09:27   DG Chest 2 View  Result Date: 04/27/2021 CLINICAL DATA:  Cough and history of prior pneumonia EXAM: CHEST - 2 VIEW COMPARISON:  04/19/2021 FINDINGS: The heart size and mediastinal contours are within normal limits. Both lungs are clear. The visualized skeletal structures are unremarkable. IMPRESSION: No active cardiopulmonary disease. Electronically Signed   By: Inez Catalina M.D.   On: 04/27/2021 23:07   DG Chest 2 View  Result Date: 04/19/2021 CLINICAL DATA:  Cough and dyspnea EXAM: CHEST - 2 VIEW COMPARISON:  09/25/2017 chest radiograph. FINDINGS: Stable cardiomediastinal silhouette with normal heart size. No pneumothorax. No pleural effusion. Faint patchy right middle lobe opacity, new. Otherwise clear lungs. IMPRESSION: Faint patchy right middle lobe opacity, new, suggesting mild pneumonia. Chest radiograph follow-up suggested  after treatment. Electronically Signed   By: Ilona Sorrel M.D.   On: 04/19/2021 09:34   CT Soft Tissue Neck W Contrast  Result Date: 05/31/2021 CLINICAL DATA:  Cough with sore throat and fever EXAM: CT NECK WITH CONTRAST TECHNIQUE: Multidetector CT imaging of the neck was performed using the standard protocol following the bolus administration of intravenous contrast. CONTRAST:  152mL OMNIPAQUE IOHEXOL 300 MG/ML  SOLN COMPARISON:  None. FINDINGS: PHARYNX AND LARYNX: The palatine tonsils are mildly enlarged. There is no peritonsillar abscess. No retropharyngeal abnormality. The epiglottis and larynx are normal. SALIVARY GLANDS: Normal parotid, submandibular and sublingual glands. THYROID: Normal. LYMPH NODES: No enlarged or abnormal density lymph nodes. VASCULAR: Major cervical vessels are patent. LIMITED INTRACRANIAL: Normal. VISUALIZED ORBITS: Normal. MASTOIDS AND VISUALIZED PARANASAL SINUSES: No fluid levels or advanced mucosal thickening. No mastoid effusion. SKELETON: No bony spinal canal stenosis. No lytic or blastic lesions. UPPER CHEST: Clear. OTHER: None. IMPRESSION: Mildly enlarged palatine tonsils, possibly indicating acute tonsillopharyngitis. No peritonsillar abscess or fluid collection. Electronically Signed   By: Ulyses Jarred M.D.   On: 05/31/2021 23:09   CT Angio Chest PE W/Cm &/Or Wo Cm  Result Date: 04/28/2021 CLINICAL DATA:  Chest pressure and shortness of breath for the past 2 weeks. EXAM: CT ANGIOGRAPHY CHEST WITH CONTRAST TECHNIQUE: Multidetector CT imaging of the chest was performed using the standard protocol during bolus administration of intravenous contrast. Multiplanar CT image reconstructions and MIPs were obtained to evaluate the vascular anatomy. CONTRAST:  35mL OMNIPAQUE IOHEXOL 350 MG/ML SOLN COMPARISON:  Chest x-ray from same day. FINDINGS: Cardiovascular: Satisfactory opacification of the pulmonary arteries to the segmental level. No evidence of pulmonary embolism. Normal  heart size. No pericardial effusion. No thoracic aortic aneurysm or dissection. Mediastinum/Nodes: No enlarged mediastinal, hilar, or axillary lymph nodes. Thyroid gland, trachea, and esophagus demonstrate no significant findings. Lungs/Pleura: Minimal subsegmental atelectasis and/or scarring in the medial right middle lobe. No focal consolidation, pleural effusion, or pneumothorax. Upper Abdomen:  No acute abnormality. 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 Musculoskeletal: No chest wall abnormality. No acute or significant osseous findings. Review of the MIP images confirms the above findings. IMPRESSION: 1. No evidence of pulmonary embolism. No acute intrathoracic process. 2. 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 Recommend further evaluation with outpatient pancreatic protocol CT or MRI with and without contrast. Electronically Signed   By: Obie DredgeWilliam T Derry M.D.   On: 04/28/2021 17:31   DG Chest Port 1 View  Result Date: 05/21/2021 CLINICAL DATA:  Cough, fever, vomiting, diarrhea.  Nonsmoker EXAM: PORTABLE CHEST 1 VIEW COMPARISON:  Chest x-ray 04/28/2021, CT chest 04/28/2021 FINDINGS: The heart and mediastinal contours are unchanged. No focal consolidation. No pulmonary edema. No pleural effusion. No pneumothorax. No acute osseous abnormality. IMPRESSION: No active disease. Electronically Signed   By: Tish FredericksonMorgane  Naveau M.D.   On: 05/21/2021 18:51   DG Chest Port 1 View  Result Date: 04/28/2021 CLINICAL DATA:  Shortness of breath. EXAM: PORTABLE CHEST 1 VIEW COMPARISON:  Chest x-ray 04/27/2021. FINDINGS: The heart size and mediastinal contours are within normal limits. Both lungs are clear. The visualized skeletal structures are unremarkable. IMPRESSION: No active disease. Electronically Signed   By: Darliss CheneyAmy  Guttmann M.D.   On: 04/28/2021 16:31   ECHOCARDIOGRAM COMPLETE  Result Date:  04/29/2021    ECHOCARDIOGRAM REPORT   Patient Name:   [place] Date of Exam: 04/29/2021 Medical Rec #:  [id number]  Height:       63.0 in Accession #:    [id number] Weight:       227.3 lb Date of Birth:  06-27-1991  BSA:          2.042 m Patient Age:    30 years   BP:           133/67 mmHg Patient Gender: F          HR:           116 bpm. [facility] Procedure: 2D Echo, Cardiac Doppler and Color Doppler Indications:    Tachycardia  History:        Patient has no prior history of Echocardiogram examinations.                 Signs/Symptoms:Dyspnea; Risk Factors:Obesity. Pneumonia.  Sonographer:    Lavenia AtlasBrooke Strickland RDCS Referring Phys: [phone] Heloise BeechamJIROGHENE E [facility] IMPRESSIONS  1. Left ventricular ejection fraction, by estimation, is 65 to 70%. The left ventricle has normal function. The left ventricle has no regional wall motion abnormalities. Left ventricular diastolic parameters were normal.  2. Right ventricular systolic function is normal. The right ventricular size is normal. Tricuspid regurgitation signal is inadequate for assessing PA pressure.  3. The mitral valve is grossly normal. No evidence of mitral valve regurgitation.  4. The aortic valve was not well visualized. Aortic valve regurgitation is not visualized.  5. The inferior vena cava is normal in size with greater than 50% respiratory variability, suggesting right atrial pressure of 3 mmHg. Comparison(s): No prior Echocardiogram. FINDINGS  Left Ventricle: Left ventricular ejection fraction, by estimation, is 65 to 70%. The left ventricle has normal function. The left ventricle has no regional wall motion abnormalities. The left ventricular internal cavity size was normal in size. There is  borderline left ventricular hypertrophy. Left ventricular diastolic parameters were normal. Right Ventricle: The right ventricular size is normal.  No increase in right ventricular wall thickness. Right ventricular systolic function is normal. Tricuspid  regurgitation signal is inadequate for assessing PA pressure. Left Atrium: Left atrial size was normal in size. Right Atrium: Right atrial size was normal in size. Pericardium: There is no evidence of pericardial effusion. Mitral Valve: The mitral valve is grossly normal. No evidence of mitral valve regurgitation. Tricuspid Valve: The tricuspid valve is grossly normal. Tricuspid valve regurgitation is trivial. Aortic Valve: The aortic valve was not well visualized. There is mild aortic valve annular calcification. Aortic valve regurgitation is not visualized. Pulmonic Valve: The pulmonic valve was grossly normal. Pulmonic valve regurgitation is trivial. Aorta: The aortic root is normal in size and structure. Venous: The inferior vena cava is normal in size with greater than 50% respiratory variability, suggesting right atrial pressure of 3 mmHg. IAS/Shunts: No atrial level shunt detected by color flow Doppler.  LEFT VENTRICLE PLAX 2D LVIDd:         4.00 cm   Diastology LVIDs:         2.60 cm   LV e' medial:    15.20 cm/s LV PW:         1.10 cm   LV E/e' medial:  6.4 LV IVS:        1.00 cm   LV e' lateral:   12.30 cm/s LVOT diam:     2.00 cm   LV E/e' lateral: 7.9 LV SV:         63 LV SV Index:   31 LVOT Area:     3.14 cm  RIGHT VENTRICLE RV Basal diam:  2.70 cm RV S prime:     28.30 cm/s TAPSE (M-mode): 2.4 cm LEFT ATRIUM             Index        RIGHT ATRIUM           Index LA diam:        2.40 cm 1.18 cm/m   RA Area:     12.80 cm LA Vol (A2C):   28.9 ml 14.15 ml/m  RA Volume:   31.20 ml  15.28 ml/m LA Vol (A4C):   40.9 ml 20.03 ml/m LA Biplane Vol: 35.0 ml 17.14 ml/m  AORTIC VALVE LVOT Vmax:   129.00 cm/s LVOT Vmean:  77.900 cm/s LVOT VTI:    0.201 m  AORTA Ao Root diam: 2.40 cm MITRAL VALVE MV Area (PHT): 6.65 cm     SHUNTS MV Decel Time: 114 msec     Systemic VTI:  0.20 m MV E velocity: 96.70 cm/s   Systemic Diam: 2.00 cm MV A velocity: 111.00 cm/s MV E/A ratio:  0.87 Rozann Lesches MD Electronically  signed by Rozann Lesches MD Signature Date/Time: 04/29/2021/8:48:20 PM    Final     Assessment and Plan :   PDMP not reviewed this encounter.  1. Chronic cough   2. Post-nasal drainage   3. Anterior chest wall pain   4. Acute left-sided thoracic back pain    Negative repeat chest x-ray.  Patient has low risk factors for pulmonary embolism, had negative chest CT scan already.  She did not want to use oral prednisone.  Therefore I offered her prednisone.  Recommended supportive care including continued use of the cough medications.  We will add Xyzal.  Emphasized need for follow-up with her PCP, consideration for referral to pulmonology. Counseled patient on potential for adverse effects with medications prescribed/recommended today, ER and return-to-clinic precautions discussed,  patient verbalized understanding.    Wallis Bamberg, [place] 06/06/21 [phone]

## 2021-06-06 NOTE — ED Triage Notes (Signed)
PT reports cough for 2 months, audible wheezing.   She reports she pulled a muscle in her left shoulder while coughing 3 days ago, it started to improve, but happened again yesterday.

## 2021-06-11 ENCOUNTER — Other Ambulatory Visit (INDEPENDENT_AMBULATORY_CARE_PROVIDER_SITE_OTHER): Payer: Self-pay

## 2021-06-11 ENCOUNTER — Encounter (INDEPENDENT_AMBULATORY_CARE_PROVIDER_SITE_OTHER): Payer: Self-pay | Admitting: Gastroenterology

## 2021-06-11 ENCOUNTER — Encounter (INDEPENDENT_AMBULATORY_CARE_PROVIDER_SITE_OTHER): Payer: Self-pay

## 2021-06-11 ENCOUNTER — Ambulatory Visit (INDEPENDENT_AMBULATORY_CARE_PROVIDER_SITE_OTHER): Payer: Medicaid Other | Admitting: Gastroenterology

## 2021-06-11 ENCOUNTER — Other Ambulatory Visit: Payer: Self-pay

## 2021-06-11 VITALS — BP 136/81 | HR 98 | Temp 98.7°F | Ht 63.0 in | Wt 228.4 lb

## 2021-06-11 DIAGNOSIS — R1310 Dysphagia, unspecified: Secondary | ICD-10-CM | POA: Insufficient documentation

## 2021-06-11 DIAGNOSIS — R1319 Other dysphagia: Secondary | ICD-10-CM

## 2021-06-11 DIAGNOSIS — K8689 Other specified diseases of pancreas: Secondary | ICD-10-CM

## 2021-06-11 NOTE — Progress Notes (Signed)
Maylon Peppers, M.D. Gastroenterology & Hepatology [facility] [address] [place], [place] [address]  Primary Care Physician: [facility] [address] [place] [address]  I will communicate my assessment and recommendations to the referring MD via EMR.  Problems: Pancreatic body/tail mass Dysphagia  History of Present Illness: Charlene Key is a 30 y.o. female with PMH HSV, h/o SVT, asthma and COVID 19 infection, who presents for follow up of pancreatic mass.  The patient was last seen on 04/29/2021 when she was hospitalized for asthma exacerbation. She was found to have an incidental mass in the pancreatic body extending to the tail (size was 3.3 x 2.1 cm) but incompletely visualized as it was seen in a CT  angio of the chest with IV contrast. GI team was consulted for this, but decision was made to evaluate the mass with a CT pancreas as her LFTs were within normal limits. Notably she was hyperglycemic but she was on steroids. Our office ordered a CT pancreas, but it has not been approved yet by her insurance.  Most recent CMP from 05/31/2021 showed a Na 135, K 3.9, Glucose 125, Cr 0.69, AST 48 ALT 26, total bilirubin 0.4, alk phosphatase 78.   Patient reports after she left the hospital she has presented dysphagia to solids. She feels sometimes the food is taking longer than usual but ahs not had to vomit as the food eventually goes down. Has had these intermittent episodes, not present with every meal. No dysphagia or odynophagia. Has had coughing spells leading to vomiting.  She is currently taking prednisolone 15 mg daily, states she will be done with it in 2 days.  The patient denies having any nausea, vomiting, fever, chills, hematochezia, melena, hematemesis, abdominal distention, abdominal pain, diarrhea, jaundice, pruritus or weight loss.  Last EGD: never Last Colonoscopy:  never  Past Medical History:History reviewed. No pertinent past medical history.  Past Surgical History: Past Surgical History:  Procedure Laterality Date   ABDOMINAL HYSTERECTOMY Bilateral 01/12/2017   Procedure: HYSTERECTOMY ABDOMINAL BILATERAL SALPINGECTOMY;  Surgeon: Schermerhorn, Gwen Her, MD;  Location: [facility] ORS;  Service: Gynecology;  Laterality: Bilateral;   CESAREAN SECTION     HYSTEROTOMY  2018    Family History: Family History  Problem Relation Age of Onset   Diabetes Mother     Social History: Social History   Tobacco Use  Smoking Status Never  Smokeless Tobacco Never   Social History   Substance and Sexual Activity  Alcohol Use Never   Social History   Substance and Sexual Activity  Drug Use No    Allergies: No Known Allergies  Medications: Current Outpatient Medications  Medication Sig Dispense Refill   albuterol (VENTOLIN HFA) 108 (90 Base) MCG/ACT inhaler Inhale 1-2 puffs into the lungs every 6 (six) hours as needed for wheezing or shortness of breath. 1 each 3   levocetirizine (XYZAL) 5 MG tablet Take 1 tablet (5 mg total) by mouth every evening. 30 tablet 0   prednisoLONE (PRELONE) 15 MG/5ML SOLN Take 15 mLs (45 mg total) by mouth daily before breakfast for 5 days. 75 mL 0   tiZANidine (ZANAFLEX) 4 MG tablet Take 1 tablet (4 mg total) by mouth at bedtime. 30 tablet 0   Vitamin D, Ergocalciferol, (DRISDOL) 1.25 MG (50000 UNIT) CAPS capsule Take 1 capsule (50,000 Units total) by mouth every 7 (seven) days. 5 capsule 0   No current facility-administered  medications for this visit.    Review of Systems: GENERAL: negative for malaise, night sweats HEENT: No changes in hearing or vision, no nose bleeds or other nasal problems. NECK: Negative for lumps, goiter, pain and significant neck swelling RESPIRATORY: Negative for cough, wheezing CARDIOVASCULAR: Negative for chest pain, leg swelling, palpitations, orthopnea GI: SEE HPI MUSCULOSKELETAL:  Negative for joint pain or swelling, back pain, and muscle pain. SKIN: Negative for lesions, rash PSYCH: Negative for sleep disturbance, mood disorder and recent psychosocial stressors. HEMATOLOGY Negative for prolonged bleeding, bruising easily, and swollen nodes. ENDOCRINE: Negative for cold or heat intolerance, polyuria, polydipsia and goiter. NEURO: negative for tremor, gait imbalance, syncope and seizures. The remainder of the review of systems is noncontributory.   Physical Exam: BP 136/81 (BP Location: Left Arm, Patient Position: Sitting, Cuff Size: Large)   Pulse 98   Temp 98.7 F (37.1 C) (Oral)   Ht _0  (1.6 m)   Wt 228 lb 6.4 oz (103.6 kg)   LMP 06/10/2015   BMI 40.46 kg/m  GENERAL: The patient is AO x3, in no acute distress. Obese. HEENT: Head is normocephalic and atraumatic. EOMI are intact. Mouth is well hydrated and without lesions. NECK: Supple. No masses LUNGS: Clear to auscultation. No presence of rhonchi/wheezing/rales. Adequate chest expansion HEART: RRR, normal s1 and s2. ABDOMEN: Soft, nontender, no guarding, no peritoneal signs, and nondistended. BS +. No masses. EXTREMITIES: Without any cyanosis, clubbing, rash, lesions or edema. NEUROLOGIC: AOx3, no focal motor deficit. SKIN: no jaundice, no rashes  Imaging/Labs: as above  I personally reviewed and interpreted the available labs, imaging and endoscopic files.  Impression and Plan: Charlene Key is a 30 y.o. female with PMH HSV, h/o SVT, asthma and COVID 19 infection, who presents for follow up of pancreatic mass. The patient was found to have an incidental pancreatic mass on imaging, but this was not visualized and characterized in detail. She was set up for a CT pancreas protocol as it is the goal standard to visualize it and would help to determine the next steps in her care. Unfortunately, her insurance has not approved this imaging which is very concerning as the etiology of the mass is unclear but could  be related to malignant pathology such adenocarcinoma (she has presented elevated blood glucose recently). We will continue reaching her insurance to obtain this imaging ASAP.  Patient reports that she has presented new onset dysphagia, which I consider should be evaluated further with an EGD with possible ED.  - Schedule EGD - Pending authorization for CT pancreas - insurance approval pending  All questions were answered.      Harvel Quale, MD Gastroenterology and Hepatology [facility]

## 2021-06-11 NOTE — Patient Instructions (Addendum)
Schedule EGD Pending authorization for CT pancreas - insurance approval pending

## 2021-06-12 ENCOUNTER — Encounter (INDEPENDENT_AMBULATORY_CARE_PROVIDER_SITE_OTHER): Payer: Self-pay

## 2021-06-25 NOTE — Patient Instructions (Signed)
[place] [facility]  06/25/2021     @PREFPERIOPPHARMACY @   Your procedure is scheduled on  07/01/2021.   Report to 07/03/2021 at  0915 A.M.   Call this number if you have problems the morning of surgery:  [phone]   Remember:  Follow the diet instructions given to you by the office.    Use your inhaler before you come and bring your rescue inhaler with you.    Take these medicines the morning of surgery with A SIP OF WATER                                     None.     Do not wear jewelry, make-up or nail polish.  Do not wear lotions, powders, or perfumes, or deodorant.  Do not shave 48 hours prior to surgery.  Men may shave face and neck.  Do not bring valuables to the hospital.  [facility] is not responsible for any belongings or valuables.  Contacts, dentures or bridgework may not be worn into surgery.  Leave your suitcase in the car.  After surgery it may be brought to your room.  For patients admitted to the hospital, discharge time will be determined by your treatment team.  Patients discharged the day of surgery will not be allowed to drive home and must have someone with them for 24 hours.    Special instructions:   DO NOT smoke tobacco or vape for 24 hours before your procedure.  Please read over the following fact sheets that you were given. Anesthesia Post-op Instructions and Care and Recovery After Surgery        Upper Endoscopy, Adult, Care After This sheet gives you information about how to care for yourself after your procedure. Your health care provider may also give you more specific instructions. If you have problems or questions, contact your health care provider. What can I expect after the procedure? After the procedure, it is common to have: A sore throat. Mild stomach pain or discomfort. Bloating. Nausea. Follow these instructions at home:  Follow instructions from your health care provider about what to eat or drink after your  procedure. Return to your normal activities as told by your health care provider. Ask your health care provider what activities are safe for you. Take over-the-counter and prescription medicines only as told by your health care provider. If you were given a sedative during the procedure, it can affect you for several hours. Do not drive or operate machinery until your health care provider says that it is safe. Keep all follow-up visits as told by your health care provider. This is important. Contact a health care provider if you have: A sore throat that lasts longer than one day. Trouble swallowing. Get help right away if: You vomit blood or your vomit looks like coffee grounds. You have: A fever. Bloody, black, or tarry stools. A severe sore throat or you cannot swallow. Difficulty breathing. Severe pain in your chest or abdomen. Summary After the procedure, it is common to have a sore throat, mild stomach discomfort, bloating, and nausea. If you were given a sedative during the procedure, it can affect you for several hours. Do not drive or operate machinery until your health care provider says that it is safe. Follow instructions from your health care provider about what to eat or drink after your procedure. Return to your  normal activities as told by your health care provider. This information is not intended to replace advice given to you by your health care provider. Make sure you discuss any questions you have with your health care provider. Document Revised: 05/05/2019 Document Reviewed: 11/29/2017 Elsevier Patient Education  2022 Elsevier Inc. Monitored Anesthesia Care, Care After This sheet gives you information about how to care for yourself after your procedure. Your health care provider may also give you more specific instructions. If you have problems or questions, contact your health care provider. What can I expect after the procedure? After the procedure, it is common to  have: Tiredness. Forgetfulness about what happened after the procedure. Impaired judgment for important decisions. Nausea or vomiting. Some difficulty with balance. Follow these instructions at home: For the time period you were told by your health care provider:   Rest as needed. Do not participate in activities where you could fall or become injured. Do not drive or use machinery. Do not drink alcohol. Do not take sleeping pills or medicines that cause drowsiness. Do not make important decisions or sign legal documents. Do not take care of children on your own. Eating and drinking Follow the diet that is recommended by your health care provider. Drink enough fluid to keep your urine pale yellow. If you vomit: Drink water, juice, or soup when you can drink without vomiting. Make sure you have little or no nausea before eating solid foods. General instructions Have a responsible adult stay with you for the time you are told. It is important to have someone help care for you until you are awake and alert. Take over-the-counter and prescription medicines only as told by your health care provider. If you have sleep apnea, surgery and certain medicines can increase your risk for breathing problems. Follow instructions from your health care provider about wearing your sleep device: Anytime you are sleeping, including during daytime naps. While taking prescription pain medicines, sleeping medicines, or medicines that make you drowsy. Avoid smoking. Keep all follow-up visits as told by your health care provider. This is important. Contact a health care provider if: You keep feeling nauseous or you keep vomiting. You feel light-headed. You are still sleepy or having trouble with balance after 24 hours. You develop a rash. You have a fever. You have redness or swelling around the IV site. Get help right away if: You have trouble breathing. You have new-onset confusion at  home. Summary For several hours after your procedure, you may feel tired. You may also be forgetful and have poor judgment. Have a responsible adult stay with you for the time you are told. It is important to have someone help care for you until you are awake and alert. Rest as told. Do not drive or operate machinery. Do not drink alcohol or take sleeping pills. Get help right away if you have trouble breathing, or if you suddenly become confused. This information is not intended to replace advice given to you by your health care provider. Make sure you discuss any questions you have with your health care provider. Document Revised: 03/14/2020 Document Reviewed: 06/01/2019 Elsevier Patient Education  2022 [employer].

## 2021-06-27 ENCOUNTER — Encounter (HOSPITAL_COMMUNITY)
Admission: RE | Admit: 2021-06-27 | Discharge: 2021-06-27 | Disposition: A | Discharge status: Home / Self Care | Payer: Medicaid Other | Source: Ambulatory Visit | Attending: Gastroenterology | Admitting: Gastroenterology

## 2021-06-27 ENCOUNTER — Other Ambulatory Visit: Payer: Self-pay

## 2021-07-01 ENCOUNTER — Encounter (HOSPITAL_COMMUNITY): Admission: RE | Payer: Self-pay | Source: Home / Self Care

## 2021-07-01 ENCOUNTER — Ambulatory Visit (HOSPITAL_COMMUNITY): Admission: RE | Admit: 2021-07-01 | Payer: Medicaid Other | Source: Home / Self Care | Admitting: Gastroenterology

## 2021-07-01 SURGERY — ESOPHAGOGASTRODUODENOSCOPY (EGD) WITH PROPOFOL
Anesthesia: Monitor Anesthesia Care

## 2021-08-04 ENCOUNTER — Ambulatory Visit (HOSPITAL_BASED_OUTPATIENT_CLINIC_OR_DEPARTMENT_OTHER): Admission: RE | Admit: 2021-08-04 | Payer: Medicaid Other | Source: Ambulatory Visit

## 2021-08-11 ENCOUNTER — Ambulatory Visit (HOSPITAL_BASED_OUTPATIENT_CLINIC_OR_DEPARTMENT_OTHER): Payer: Medicaid Other | Attending: Gastroenterology

## 2021-08-25 ENCOUNTER — Encounter (HOSPITAL_BASED_OUTPATIENT_CLINIC_OR_DEPARTMENT_OTHER): Payer: Self-pay

## 2021-08-25 ENCOUNTER — Other Ambulatory Visit: Payer: Self-pay

## 2021-08-25 ENCOUNTER — Ambulatory Visit (HOSPITAL_BASED_OUTPATIENT_CLINIC_OR_DEPARTMENT_OTHER)
Admission: RE | Admit: 2021-08-25 | Discharge: 2021-08-25 | Disposition: A | Discharge status: Home / Self Care | Payer: Medicaid Other | Source: Ambulatory Visit | Attending: Gastroenterology | Admitting: Gastroenterology

## 2021-08-25 DIAGNOSIS — K8689 Other specified diseases of pancreas: Secondary | ICD-10-CM | POA: Insufficient documentation

## 2021-08-25 MED ORDER — IOHEXOL 300 MG/ML  SOLN
100.0000 mL | Freq: Once | INTRAMUSCULAR | Status: AC | PRN
  Administered 2021-08-25: 100 mL via INTRAVENOUS

## 2021-09-16 ENCOUNTER — Emergency Department (HOSPITAL_COMMUNITY)
Admission: EM | Admit: 2021-09-16 | Discharge: 2021-09-16 | Disposition: A | Discharge status: Home / Self Care | Payer: Medicaid Other | Attending: Emergency Medicine | Admitting: Emergency Medicine

## 2021-09-16 ENCOUNTER — Emergency Department (HOSPITAL_COMMUNITY): Payer: Medicaid Other

## 2021-09-16 ENCOUNTER — Encounter (HOSPITAL_COMMUNITY): Payer: Self-pay | Admitting: *Deleted

## 2021-09-16 DIAGNOSIS — R0602 Shortness of breath: Secondary | ICD-10-CM | POA: Insufficient documentation

## 2021-09-16 DIAGNOSIS — R5383 Other fatigue: Secondary | ICD-10-CM | POA: Diagnosis not present

## 2021-09-16 DIAGNOSIS — Z5321 Procedure and treatment not carried out due to patient leaving prior to being seen by health care provider: Secondary | ICD-10-CM | POA: Insufficient documentation

## 2021-09-16 NOTE — ED Triage Notes (Signed)
C/o shortness of breath , feeling tired ?

## 2021-11-20 ENCOUNTER — Ambulatory Visit
Admission: EM | Admit: 2021-11-20 | Discharge: 2021-11-20 | Disposition: A | Discharge status: Home / Self Care | Payer: Medicaid Other | Attending: Family Medicine | Admitting: Family Medicine

## 2021-11-20 DIAGNOSIS — R0789 Other chest pain: Secondary | ICD-10-CM | POA: Diagnosis not present

## 2021-11-20 DIAGNOSIS — J069 Acute upper respiratory infection, unspecified: Secondary | ICD-10-CM | POA: Diagnosis not present

## 2021-11-20 MED ORDER — PROMETHAZINE-DM 6.25-15 MG/5ML PO SYRP: 5.0000 mL | ORAL_SOLUTION | Freq: Four times a day (QID) | ORAL | 0 refills | Status: DC | PRN

## 2021-11-20 MED ORDER — FLUTICASONE-SALMETEROL 250-50 MCG/ACT IN AEPB: 1.0000 | INHALATION_SPRAY | Freq: Two times a day (BID) | RESPIRATORY_TRACT | 0 refills | Status: AC

## 2021-11-20 NOTE — ED Provider Notes (Signed)
?[facility] ? ? ? ?CSN: [id number] ?Arrival date & time: 11/20/21  [id number] ? ? ?  ? ?History   ?Chief Complaint ?Chief Complaint  ?Patient presents with  ? Shortness of Breath  ?  Fever, headache, nausea, congestion and SOB  ? ? ?HPI ?Charlene Key is a 31 y.o. female.  ? ?Patient presenting today with 3-day history of sore throat, headache, diarrhea, chest tightness, shortness of breath, nasal congestion.  Denies fever, chills, body aches, chest pain, shortness of breath, abdominal pain, nausea, vomiting.  So far trying Tylenol and ibuprofen with mild temporary relief of symptoms.  She states prior to onset of symptoms she had aspirated a bit of her saliva and is concerned now after googling her symptoms that she has aspiration pneumonia.  She denies any diagnosed history of asthma but thinks she may have it as she requires an albuterol inhaler from time to time.  No known sick contacts recently. ? ? ?History reviewed. No pertinent past medical history. ? ?Patient Active Problem List  ? Diagnosis Date Noted  ? Dysphagia 06/11/2021  ? Sinus tachycardia 04/29/2021  ? Severe Vitamin D deficiency 04/29/2021  ? SOB (shortness of breath)   ? Tachycardia 04/28/2021  ? Pancreatic mass 04/28/2021  ? Post-operative state 01/12/2017  ? Abnormal uterine bleeding, postpartum 01/11/2017  ? History of herpes genitalis 10/27/2016  ? Pregnancy 10/16/2016  ? History of preterm delivery 09/27/2015  ? Short cervix 09/07/2015  ? 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 Obesity (BMI 30-39.9) 09/05/2015  ? ? ?Past Surgical History:  ?Procedure Laterality Date  ? ABDOMINAL HYSTERECTOMY Bilateral 01/12/2017  ? Procedure: HYSTERECTOMY ABDOMINAL BILATERAL SALPINGECTOMY;  Surgeon: Schermerhorn, Gwen Her, MD;  Location: [facility] ORS;  Service: Gynecology;  Laterality: Bilateral;  ? CESAREAN SECTION    ? HYSTEROTOMY  2018  ? ? ?OB History   ? ? Gravida  ?7  ? Para  ?6  ? Term  ?5  ? Preterm  ?1  ? AB  ?   ? Living  ?6  ?  ? ? SAB  ?   ?  IAB  ?   ? Ectopic  ?   ? Multiple  ?1  ? Live Births  ?7  ?   ?  ?  ? ? ? ?Home Medications   ? ?Prior to Admission medications   ?Medication Sig Start Date End Date Taking? Authorizing Provider  ?fluticasone-salmeterol (ADVAIR DISKUS) 250-50 MCG/ACT AEPB Inhale 1 puff into the lungs in the morning and at bedtime. Rinse mouth with water after each use 11/20/21  Yes Volney American, PA-C  ?promethazine-dextromethorphan (PROMETHAZINE-DM) 6.25-15 MG/5ML syrup Take 5 mLs by mouth 4 (four) times daily as needed. 11/20/21  Yes Volney American, PA-C  ?albuterol (VENTOLIN HFA) 108 (90 Base) MCG/ACT inhaler Inhale 1-2 puffs into the lungs every 6 (six) hours as needed for wheezing or shortness of breath. 04/30/21   Manuella Ghazi, Pratik D, DO  ?levocetirizine (XYZAL) 5 MG tablet Take 1 tablet (5 mg total) by mouth every evening. ?Patient not taking: Reported on 06/26/2021 06/06/21   Jaynee Eagles, PA-C  ?tiZANidine (ZANAFLEX) 4 MG tablet Take 1 tablet (4 mg total) by mouth at bedtime. ?Patient not taking: Reported on 06/26/2021 06/06/21   Jaynee Eagles, PA-C  ?Vitamin D, Ergocalciferol, (DRISDOL) 1.25 MG (50000 UNIT) CAPS capsule Take 1 capsule (50,000 Units total) by mouth every 7 (seven) days. ?Patient not taking: Reported on 06/26/2021 05/06/21   Heath Lark D, DO  ? ? ?  Family History ?Family History  ?Problem Relation Age of Onset  ? Diabetes Mother   ? ? ?Social History ?Social History  ? ?Tobacco Use  ? Smoking status: Never  ? Smokeless tobacco: Never  ?Vaping Use  ? Vaping Use: Never used  ?Substance Use Topics  ? Alcohol use: Never  ? Drug use: No  ? ? ? ?Allergies   ?Patient has no known allergies. ? ? ?Review of Systems ?Review of Systems ?Per HPI ? ?Physical Exam ?Triage Vital Signs ?ED Triage Vitals  ?Enc Vitals Group  ?   BP 11/20/21 1057 121/88  ?   Pulse Rate 11/20/21 1057 73  ?   Resp 11/20/21 1057 20  ?   Temp 11/20/21 1057 98.8 ?F (37.1 ?C)  ?   Temp Source 11/20/21 1057 Oral  ?   SpO2 11/20/21 1057 98 %   ?   Weight --   ?   Height --   ?   Head Circumference --   ?   Peak Flow --   ?   Pain Score 11/20/21 1055 2  ?   Pain Loc --   ?   Pain Edu? --   ?   Excl. in [place]? --   ? ?No data found. ? ?Updated Vital Signs ?BP 121/88 (BP Location: Right Arm)   Pulse 73   Temp 98.8 ?F (37.1 ?C) (Oral)   Resp 20   LMP 06/10/2015   SpO2 98%  ? ?Visual Acuity ?Right Eye Distance:   ?Left Eye Distance:   ?Bilateral Distance:   ? ?Right Eye Near:   ?Left Eye Near:    ?Bilateral Near:    ? ?Physical Exam ?Vitals and nursing note reviewed.  ?Constitutional:   ?   Appearance: Normal appearance.  ?HENT:  ?   Head: Atraumatic.  ?   Right Ear: Tympanic membrane and external ear normal.  ?   Left Ear: Tympanic membrane and external ear normal.  ?   Nose: Rhinorrhea present.  ?   Mouth/Throat:  ?   Mouth: Mucous membranes are moist.  ?   Pharynx: Posterior oropharyngeal erythema present.  ?Eyes:  ?   Extraocular Movements: Extraocular movements intact.  ?   Conjunctiva/sclera: Conjunctivae normal.  ?Cardiovascular:  ?   Rate and Rhythm: Normal rate and regular rhythm.  ?   Heart sounds: Normal heart sounds.  ?Pulmonary:  ?   Effort: Pulmonary effort is normal.  ?   Breath sounds: Normal breath sounds. No wheezing or rales.  ?Musculoskeletal:     ?   General: Normal range of motion.  ?   Cervical back: Normal range of motion and neck supple.  ?Skin: ?   General: Skin is warm and dry.  ?Neurological:  ?   Mental Status: She is alert and oriented to person, place, and time.  ?Psychiatric:     ?   Mood and Affect: Mood normal.     ?   Thought Content: Thought content normal.  ? ? ? ?UC Treatments / Results  ?Labs ?(all labs ordered are listed, but only abnormal results are displayed) ?Labs Reviewed  ?COVID-19, FLU A+B NAA  ? ? ?EKG ? ? ?Radiology ?No results found. ? ?Procedures ?Procedures (including critical care time) ? ?Medications Ordered in UC ?Medications - No data to display ? ?Initial Impression / Assessment and Plan / UC Course   ?I have reviewed the triage vital signs and the nursing notes. ? ?Pertinent labs & imaging results that  were available during my care of the patient were reviewed by me and considered in my medical decision making (see chart for details). ? ?  ? ?Vital signs and exam overall reassuring and suspect a viral illness causing her symptoms.  Very low suspicion for any aspiration pneumonia given clear lung sounds bilaterally, oxygen saturation of 98% on room air.  Reassurance given to patient on this.  We will await COVID and flu testing, treat with Phenergan DM, DayQuil, NyQuil and Advair inhaler as the albuterol alone is not providing significant benefit to her wheezing and chest tightness.  Work note given.  Return for acutely worsening symptoms. ? ?Final Clinical Impressions(s) / UC Diagnoses  ? ?Final diagnoses:  ?Viral URI with cough  ?Chest tightness  ? ?Discharge Instructions   ?None ?  ? ?ED Prescriptions   ? ? Medication Sig Dispense Auth. Provider  ? fluticasone-salmeterol (ADVAIR DISKUS) 250-50 MCG/ACT AEPB Inhale 1 puff into the lungs in the morning and at bedtime. Rinse mouth with water after each use 60 each Volney American, PA-C  ? promethazine-dextromethorphan (PROMETHAZINE-DM) 6.25-15 MG/5ML syrup Take 5 mLs by mouth 4 (four) times daily as needed. 100 mL Volney American, [place]  ? ?  ? ?PDMP not reviewed this encounter. ?  ?Volney American, PA-C ?11/20/21 1124 ? ?

## 2021-11-20 NOTE — ED Triage Notes (Signed)
Pt states that about 3 days ago she aspirated and the next morning she had a sore throat, headache, diarrhea and SOB ? ?Pt states she goggled and it said she may have pneumonia ? ?Pt states she tried Ibuprofen and Tylenol ? ?Denies Fever ?

## 2021-11-21 LAB — COVID-19, FLU A+B NAA
Influenza A, NAA: NOT DETECTED
Influenza B, NAA: NOT DETECTED
SARS-CoV-2, NAA: NOT DETECTED

## 2022-06-08 ENCOUNTER — Ambulatory Visit
Admission: EM | Admit: 2022-06-08 | Discharge: 2022-06-08 | Disposition: A | Discharge status: Home / Self Care | Payer: Medicaid Other

## 2022-06-08 DIAGNOSIS — J0300 Acute streptococcal tonsillitis, unspecified: Secondary | ICD-10-CM

## 2022-06-08 LAB — POCT RAPID STREP A (OFFICE): Rapid Strep A Screen: POSITIVE — AB

## 2022-06-08 MED ORDER — AMOXICILLIN 875 MG PO TABS: 875.0000 mg | ORAL_TABLET | Freq: Two times a day (BID) | ORAL | 0 refills | Status: DC

## 2022-06-08 NOTE — ED Triage Notes (Signed)
Pt reports sore throat x 2 days. Sudafed gives no relief.

## 2022-06-08 NOTE — ED Provider Notes (Signed)
[facility]    CSN: [id number] Arrival date & time: 06/08/22  0815      History   Chief Complaint Chief Complaint  Patient presents with   Sore Throat    HPI Charlene Key is a 31 y.o. female.   Presenting today with 2-day history of sore throat, neck soreness, headache, fatigue, mild body aches.  Denies cough, congestion, chest pain, shortness of breath.  Tried Sudafed with mild relief of the headaches.  No known sick contacts recently.    History reviewed. No pertinent past medical history.  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 Authorizing Provider  amoxicillin (AMOXIL) 875 MG tablet Take 1 tablet (875 mg total) by mouth 2 (two) times daily. 06/08/22  Yes Particia Nearing, PA-C  pseudoephedrine (SUDAFED) 30 MG tablet Take 30 mg by mouth every 4 (four) hours as needed for congestion.    Yes [provider]  albuterol (VENTOLIN HFA) 108 (90 Base) MCG/ACT inhaler Inhale 1-2 puffs into the lungs every 6 (six) hours as needed for wheezing or shortness of breath. 04/30/21   Sherryll Burger, Pratik D, DO  fluticasone-salmeterol (ADVAIR DISKUS) 250-50 MCG/ACT AEPB Inhale 1 puff into the lungs in the morning and at bedtime. Rinse mouth with water after each use 11/20/21   Particia Nearing, PA-C  levocetirizine (XYZAL) 5 MG tablet Take 1 tablet (5 mg total) by mouth every evening. Patient not taking: Reported on 06/26/2021 06/06/21   Wallis Bamberg, PA-C  promethazine-dextromethorphan (PROMETHAZINE-DM) 6.25-15 MG/5ML syrup Take 5 mLs by mouth 4 (four) times daily as needed. 11/20/21   Particia Nearing, PA-C  tiZANidine (ZANAFLEX) 4 MG tablet Take 1 tablet (4 mg total) by mouth at bedtime. Patient not taking: Reported on 06/26/2021 06/06/21   Wallis Bamberg, PA-C  Vitamin D, Ergocalciferol, (DRISDOL) 1.25 MG (50000 UNIT) CAPS capsule Take 1 capsule (50,000 Units total) by mouth every 7 (seven) days. Patient not taking: Reported on 06/26/2021 05/06/21   Maurilio Lovely D, DO    Family History Family History  Problem Relation Age of Onset   Diabetes Mother     Social History Social History  Tobacco Use   Smoking status: Never   Smokeless tobacco: Never  Vaping Use   Vaping Use: Never used  Substance Use Topics   Alcohol use: Never   Drug use: No     Allergies   Patient has no known allergies.   Review of Systems Review of Systems PER HPI  Physical Exam Triage Vital Signs ED Triage Vitals  Enc Vitals Group     BP 06/08/22 0824 124/86     Pulse Rate 06/08/22 0824 96     Resp 06/08/22 0824 16     Temp 06/08/22 0824 98.8 F (37.1 C)     Temp Source 06/08/22 0824 Oral     SpO2 06/08/22 0824 95 %     Weight --      Height --      Head Circumference --      Peak Flow --      Pain Score 06/08/22 0823 7     Pain Loc --      Pain Edu? --      Excl. in GC? --     No data found.  Updated Vital Signs BP 124/86 (BP Location: Right Arm)   Pulse 96   Temp 98.8 F (37.1 C) (Oral)   Resp 16   LMP 06/10/2015   SpO2 95%   Visual Acuity Right Eye Distance:   Left Eye Distance:   Bilateral Distance:    Right Eye Near:   Left Eye Near:    Bilateral Near:     Physical Exam Vitals and nursing note reviewed.  Constitutional:      Appearance: Normal appearance.  HENT:     Head: Atraumatic.     Right Ear: Tympanic membrane and external ear normal.     Left Ear: Tympanic membrane and external ear normal.     Nose: Nose normal.     Mouth/Throat:     Mouth: Mucous membranes are moist.     Pharynx: Posterior oropharyngeal erythema present. No oropharyngeal exudate.  Eyes:     Extraocular Movements: Extraocular movements intact.     Conjunctiva/sclera: Conjunctivae normal.  Cardiovascular:     Rate and Rhythm: Normal rate and regular rhythm.     Heart sounds: Normal heart sounds.  Pulmonary:     Effort: Pulmonary effort is normal.     Breath sounds: Normal breath sounds. No wheezing or rales.  Musculoskeletal:        General: Normal range of motion.     Cervical back: Normal range of motion and neck supple.  Lymphadenopathy:     Cervical: Cervical adenopathy present.  Skin:    General: Skin is warm and dry.  Neurological:     Mental Status: She is alert and oriented to person, place, and time.     Motor: No weakness.     Gait: Gait normal.  Psychiatric:        Mood and Affect: Mood normal.        Thought Content: Thought content normal.     UC Treatments / Results  Labs (all labs ordered are listed, but only abnormal results are displayed) Labs Reviewed  POCT RAPID STREP A (OFFICE) - Abnormal; Notable for the following components:      Result Value   Rapid Strep A Screen Positive (*)    All other components within normal limits   EKG  Radiology No results found.  Procedures Procedures (including critical care  time)  Medications Ordered in UC Medications -  No data to display  Initial Impression / Assessment and Plan / UC Course  I have reviewed the triage vital signs and the nursing notes.  Pertinent labs & imaging results that were available during my care of the patient were reviewed by me and considered in my medical decision making (see chart for details).     Vital signs reassuring today, rapid strep positive.  Treat with amoxicillin, supportive over-the-counter medications and home care.  Return for worsening symptoms.  Work note given.  Final Clinical Impressions(s) / UC Diagnoses   Final diagnoses:  Strep tonsillitis   Discharge Instructions   None    ED Prescriptions     Medication Sig Dispense Auth. Provider   amoxicillin (AMOXIL) 875 MG tablet Take 1 tablet (875 mg total) by mouth 2 (two) times daily. 20 tablet Particia Nearing, [place]      PDMP not reviewed this encounter.   Particia Nearing, [place] 06/08/22 1024

## 2022-06-12 IMAGING — CT CT ABDOMEN WO/W CM
2 of 12 series · 10 of 46 positions shown, 16 images · IV contrast (APPLIED)
Comparison: 04/28/2021 chest CT

CLINICAL DATA: Pancreatic mass suggested on prior chest CT of
04/28/2021, for further workup.

EXAM:
CT ABDOMEN WITHOUT AND WITH CONTRAST
TECHNIQUE: Multidetector CT imaging of the abdomen was performed following the
standard protocol before and following the bolus administration of
intravenous contrast.

[Series 7: cor arterial · coronal · arterial · 0.53mm/px · 3 of 172 slices shown, 4 images]
[im 43/172  soft-tissue]
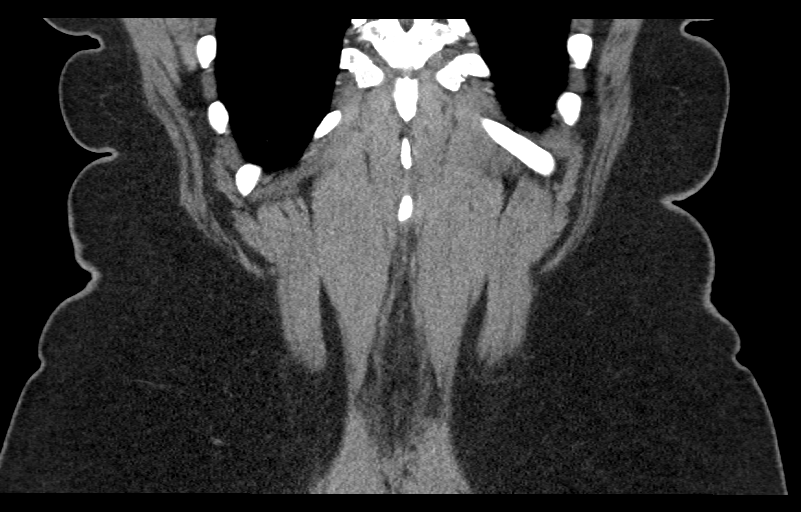
[im 86/172  soft-tissue]
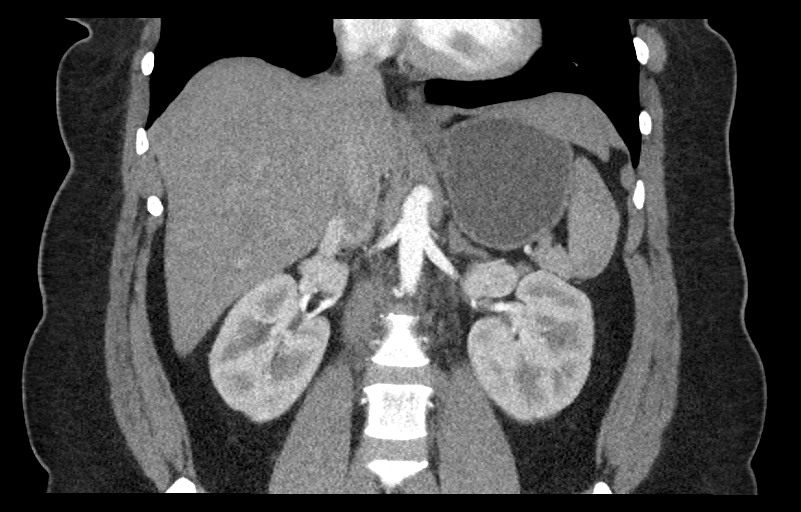
[im 86/172  bone]
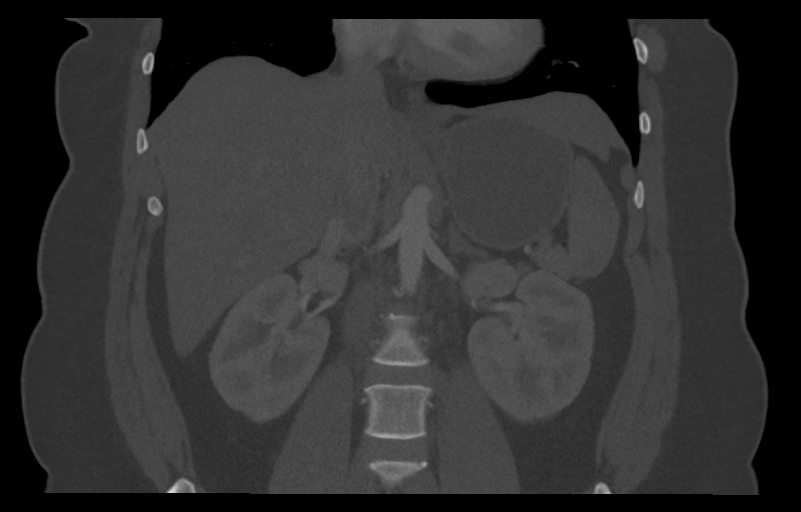
[im 129/172  soft-tissue]
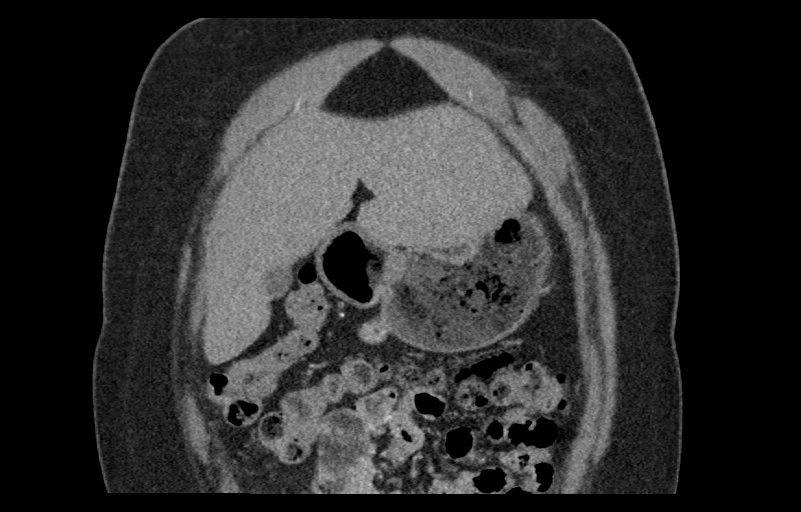

[Series 13: portal pv 2.0 · axial · portal-venous · 0.78mm/px · z∈[-196,-8]mm · 7 of 126 slices shown, 12 images]
[im 16/126  soft-tissue]
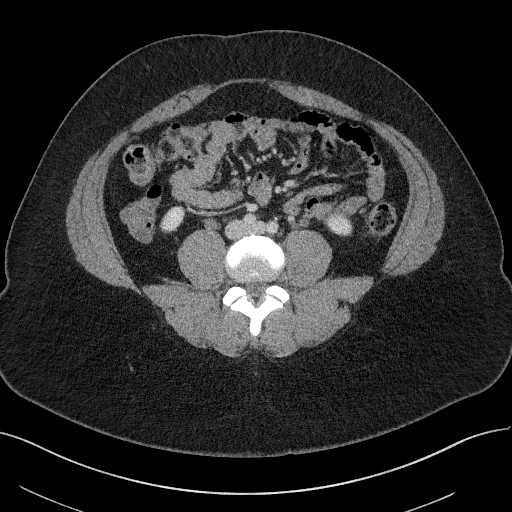
[im 16/126  bone]
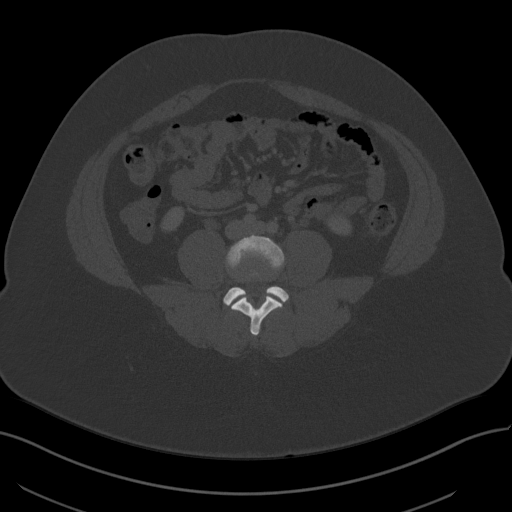
[im 32/126  soft-tissue]
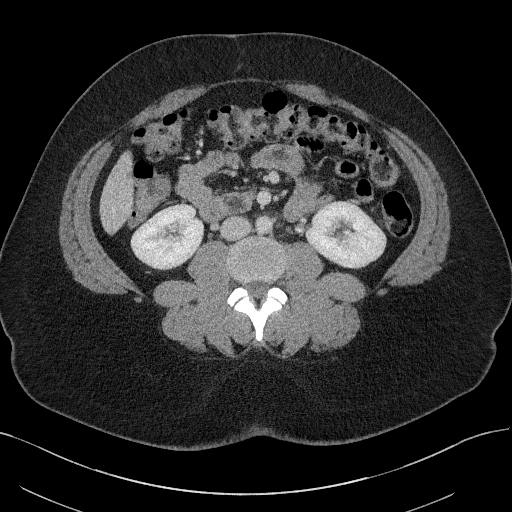
[im 47/126  soft-tissue]
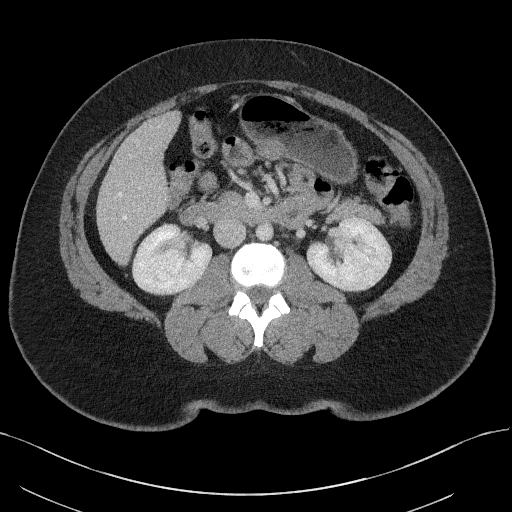
[im 63/126  soft-tissue]
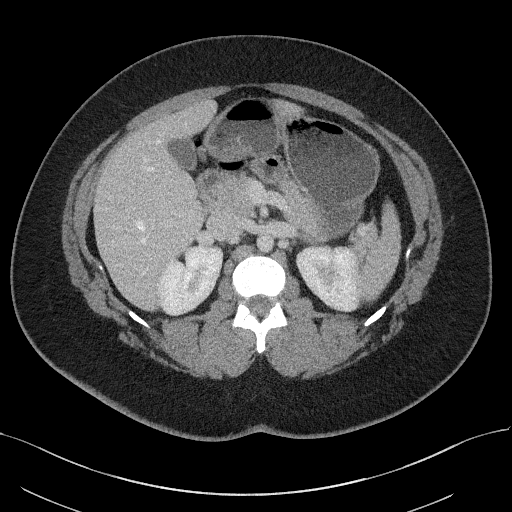
[im 63/126  lung]
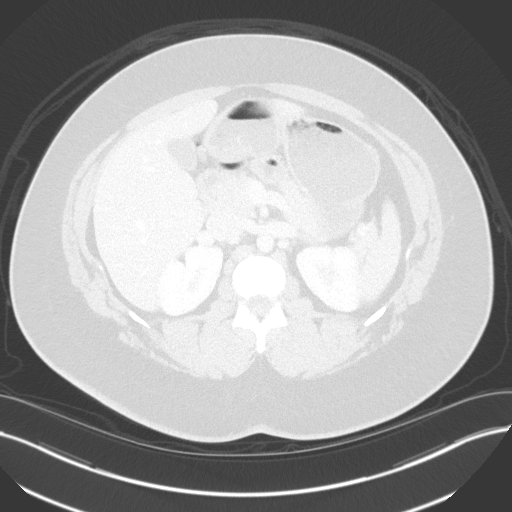
[im 79/126  soft-tissue]
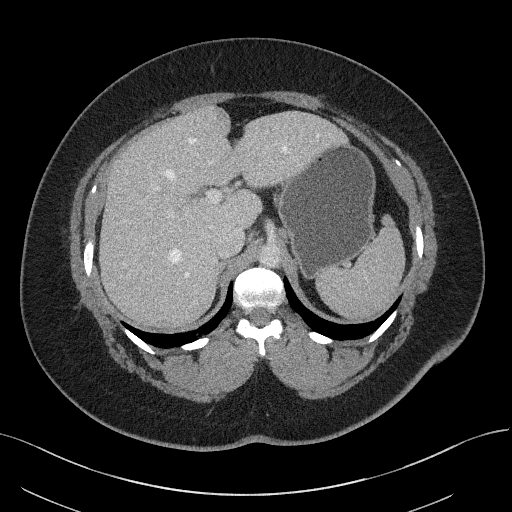
[im 79/126  lung]
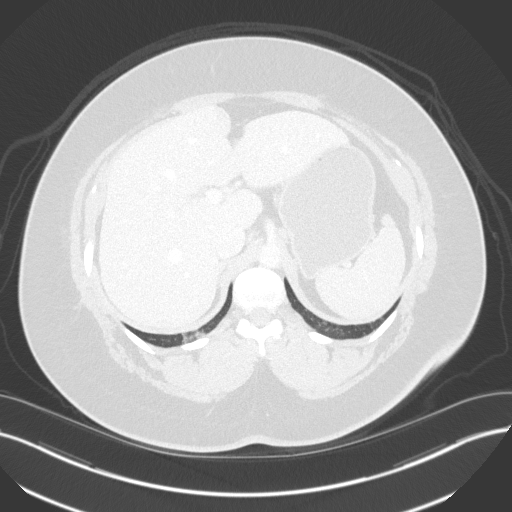
[im 94/126  soft-tissue]
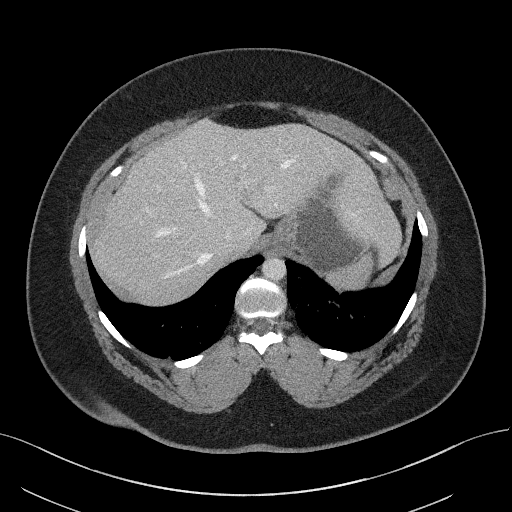
[im 94/126  lung]
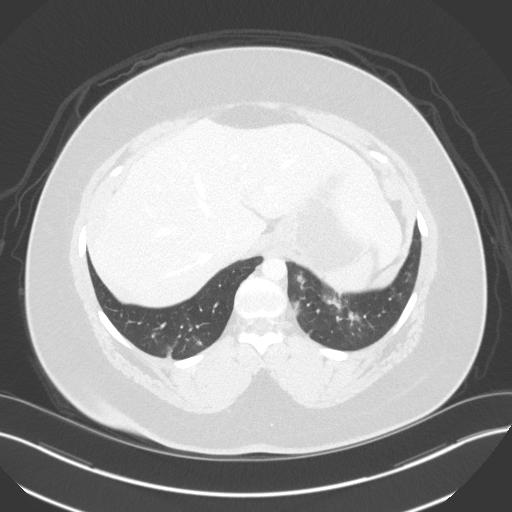
[im 110/126  soft-tissue]
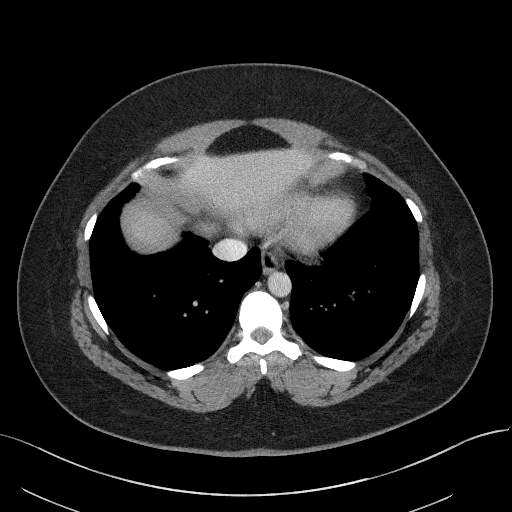
[im 110/126  lung]
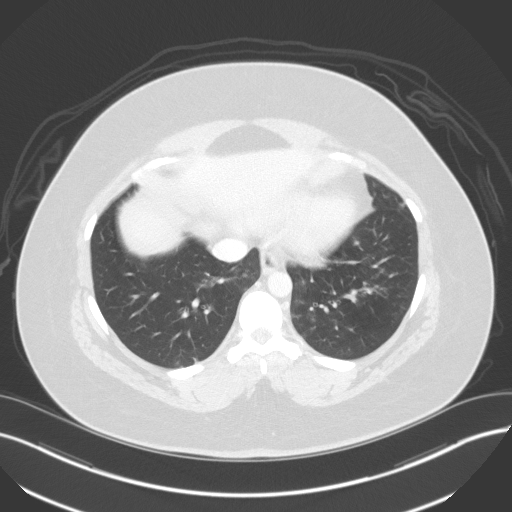

[10 of 46 positions shown; findings below may reference images not displayed]

RADIATION DOSE REDUCTION: This exam was performed according to the
departmental dose-optimization program which includes automated
exposure control, adjustment of the mA and/or kV according to
patient size and/or use of iterative reconstruction technique.

CONTRAST:  100mL OMNIPAQUE IOHEXOL 300 MG/ML  SOLN
FINDINGS: Lower chest: Scattered ground-glass density nodules in both lower
lobes, left greater than right, and also in the lingula. There is
also some part solid and solid nodularity including a 0.8 by 0.6 cm
nodule in the left lower lobe on image 5 of series 6 which is
solid-appearing. Mild volume loss in the left lower lobe.

Hepatobiliary: No significant abnormality observed.

Pancreas: No pancreatic mass is demonstrated. The masslike
appearance on prior CT of 04/28/2021 is believed to be due to part
of the duodenum sitting immediately adjacent to the pancreatic tail.
This is more clearly shown on today's examination which fully
characterizes the duodenum.

Spleen: Unremarkable

Adrenals/Urinary Tract: Unremarkable

Stomach/Bowel: Unremarkable

Vascular/Lymphatic: Subtle nonspecific stranding in the
retroperitoneum without pathologic adenopathy. No substantial
vascular abnormality observed.

Other: No supplemental non-categorized findings.

Musculoskeletal: Small umbilical hernia contains adipose tissue.
IMPRESSION: 1. No pancreatic mass is demonstrated. The appearance on prior chest
CT is believed to be due to part of the duodenum immediately
adjacent to the pancreas simulating a mass.
2. New scattered primarily ground-glass nodularity both lower lobes
although with some mixed density nodularity and at least one solid
nodule in the left lower lobe measuring 8 by 6 mm. The clustering of
the nodularity and indistinct margins favor an infectious or
inflammatory process such as atypical pneumonia, consider chest
radiographic follow up to ensure complete clearance.

## 2022-08-11 ENCOUNTER — Emergency Department: Payer: Self-pay

## 2022-08-11 ENCOUNTER — Encounter (HOSPITAL_COMMUNITY): Payer: Self-pay | Admitting: Emergency Medicine

## 2022-08-11 ENCOUNTER — Emergency Department (HOSPITAL_COMMUNITY): Payer: Medicaid Other

## 2022-08-11 ENCOUNTER — Other Ambulatory Visit: Payer: Self-pay

## 2022-08-11 ENCOUNTER — Emergency Department (HOSPITAL_COMMUNITY)
Admission: EM | Admit: 2022-08-11 | Discharge: 2022-08-11 | Disposition: A | Discharge status: Home / Self Care | Payer: Medicaid Other | Attending: Emergency Medicine | Admitting: Emergency Medicine

## 2022-08-11 DIAGNOSIS — M25511 Pain in right shoulder: Secondary | ICD-10-CM | POA: Diagnosis not present

## 2022-08-11 DIAGNOSIS — M545 Low back pain, unspecified: Secondary | ICD-10-CM | POA: Diagnosis not present

## 2022-08-11 DIAGNOSIS — M7918 Myalgia, other site: Secondary | ICD-10-CM

## 2022-08-11 DIAGNOSIS — M62838 Other muscle spasm: Secondary | ICD-10-CM | POA: Diagnosis not present

## 2022-08-11 DIAGNOSIS — Y92410 Unspecified street and highway as the place of occurrence of the external cause: Secondary | ICD-10-CM | POA: Diagnosis not present

## 2022-08-11 MED ORDER — IBUPROFEN 400 MG PO TABS: 400.0000 mg | ORAL_TABLET | Freq: Three times a day (TID) | ORAL | 0 refills | Status: DC | PRN

## 2022-08-11 MED ORDER — METHOCARBAMOL 500 MG PO TABS: 500.0000 mg | ORAL_TABLET | Freq: Three times a day (TID) | ORAL | 0 refills | Status: DC | PRN
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
## 2022-10-15 ENCOUNTER — Ambulatory Visit
Admission: EM | Admit: 2022-10-15 | Discharge: 2022-10-15 | Disposition: A | Discharge status: Home / Self Care | Payer: Medicaid Other | Attending: Nurse Practitioner | Admitting: Nurse Practitioner

## 2022-10-15 DIAGNOSIS — J069 Acute upper respiratory infection, unspecified: Secondary | ICD-10-CM | POA: Diagnosis not present

## 2022-10-15 MED ORDER — PREDNISONE 20 MG PO TABS: 40.0000 mg | ORAL_TABLET | Freq: Every day | ORAL | 0 refills | Status: AC

## 2022-10-15 MED ORDER — ALBUTEROL SULFATE HFA 108 (90 BASE) MCG/ACT IN AERS: 2.0000 | INHALATION_SPRAY | Freq: Four times a day (QID) | RESPIRATORY_TRACT | 0 refills | Status: AC | PRN

## 2022-10-15 MED ORDER — PROMETHAZINE-DM 6.25-15 MG/5ML PO SYRP: 5.0000 mL | ORAL_SOLUTION | Freq: Four times a day (QID) | ORAL | 0 refills | Status: DC | PRN

## 2022-10-15 NOTE — ED Triage Notes (Signed)
Pt reports she has chest congestion, runny, headaches, coughing a lot, and SOB x 2 weeks.

## 2022-10-15 NOTE — Discharge Instructions (Signed)
Take medication as prescribed. Increase fluids and allow for plenty of rest. Try to drink at least 8-10 8 ounce glasses of water daily while symptoms persist. May take over-the-counter Tylenol or ibuprofen as needed for pain, fever, or general discomfort. Recommend using a humidifier in your bedroom at nighttime during sleep and sleeping elevated on pillows while cough symptoms persist. As discussed, if the cough does not improve over the next week, or if you get new symptoms to include wheezing, fever rater than 100.4, chills, or new symptoms, please follow-up in this clinic for further evaluation. Follow-up as needed.

## 2022-10-15 NOTE — ED Provider Notes (Signed)
[facility]    CSN: [id number] Arrival date & time: 10/15/22  1840      History   Chief Complaint No chief complaint on file.   HPI Charlene Key is a 32 y.o. female.   The history is provided by the patient.   The patient presents for complaints of chest congestion, runny nose, headaches, cough, and shortness of breath.  Patient states symptoms have been present for the past 2 weeks.  During that time, patient was treated for strep throat.  She states when her symptoms started, she was taking amoxicillin 500 mg for strep throat.  She states that during that time, she was also seeing her dentist, who stopped that amoxicillin that was previously prescribed and started her on another prescription of amoxicillin.  Patient states she finished the amoxicillin approximately 2 days ago.  Patient states that she has had a low-grade temperature, and that she has been using her albuterol inhaler for shortness of breath.  She states that she does not have an official diagnosis of asthma, but has been prescribed inhalers in the past.  Review of the patient's chart does show history of chronic cough.  Patient states that she has not been taking any medication for her symptoms other than the albuterol inhaler and NyQuil.    History reviewed. No pertinent past medical history.  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 Authorizing Provider  albuterol (VENTOLIN HFA) 108 (90 Base) MCG/ACT inhaler Inhale 2 puffs into the lungs every 6 (six) hours as needed for wheezing or shortness of breath. 10/15/22  Yes Zareya Tuckett-Warren, Alda Lea, NP  predniSONE (DELTASONE) 20 MG tablet Take 2 tablets (40 mg total) by mouth daily with breakfast for 5 days. 10/15/22 10/20/22 Yes Chase Arnall-Warren, Alda Lea, NP  promethazine-dextromethorphan (PROMETHAZINE-DM) 6.25-15 MG/5ML syrup Take 5 mLs by mouth 4 (four) times daily as needed. 10/15/22  Yes Markiya Keefe-Warren, Alda Lea, NP  fluticasone-salmeterol (ADVAIR DISKUS) 250-50 MCG/ACT AEPB Inhale 1 puff into the lungs in the morning and at bedtime. Rinse mouth with water after each use Patient not taking: Reported on 08/11/2022 11/20/21   Volney American, PA-C  ibuprofen (ADVIL) 400 MG tablet Take 1 tablet (400 mg total) by mouth every 8 (eight) hours as needed. 08/11/22   Evalee Jefferson, PA-C  methocarbamol (ROBAXIN) 500 MG tablet Take 1 tablet (500 mg total) by mouth every 8 (eight) hours as needed for muscle spasms. 08/11/22   Evalee Jefferson, PA-C    Family History Family History  Problem Relation Age of Onset   Diabetes Mother     Social History Social History   Tobacco Use   Smoking status: Never   Smokeless tobacco: Never  Vaping Use   Vaping Use: Never used  Substance Use Topics   Alcohol use: Never   Drug use: No     Allergies   Patient has no known allergies.   Review  of Systems Review of Systems Per HPI  Physical Exam Triage Vital Signs ED Triage Vitals  Enc Vitals Group     BP 10/15/22 1917 (!) 144/88     Pulse Rate 10/15/22 1917 (!) 111     Resp 10/15/22 1917 20     Temp 10/15/22 1917 99 F (37.2 C)     Temp src --      SpO2 10/15/22 1917 93 %     Weight --      Height --      Head Circumference --      Peak Flow --      Pain Score 10/15/22 1919 0     Pain Loc --      Pain Edu? --      Excl. in [place]? --    No data found.  Updated Vital Signs BP (!) 144/88 (BP Location: Right Arm)   Pulse (!) 111   Temp 99 F (37.2 C)   Resp 20   LMP 06/10/2015   SpO2 93%   Visual Acuity Right Eye Distance:   Left Eye Distance:   Bilateral Distance:    Right Eye Near:   Left Eye Near:    Bilateral Near:     Physical Exam Vitals and nursing note reviewed.  Constitutional:      General: She is not in acute distress.    Appearance: Normal appearance.  HENT:     Head: Normocephalic.     Right Ear: Tympanic membrane, ear canal and external ear normal.     Left Ear: Tympanic membrane, ear canal and external ear normal.     Nose: Congestion present. No rhinorrhea.     Mouth/Throat:     Mouth: Mucous membranes are moist.     Pharynx: Posterior oropharyngeal erythema present.  Eyes:     Extraocular Movements: Extraocular movements intact.     Conjunctiva/sclera: Conjunctivae normal.     Pupils: Pupils are equal, round, and reactive to light.  Cardiovascular:     Rate and Rhythm: Regular rhythm. Tachycardia present.     Pulses: Normal pulses.     Heart sounds: Normal heart sounds.  Pulmonary:     Effort: Pulmonary effort is normal. No respiratory distress.     Breath sounds: Normal breath sounds. No stridor. No wheezing, rhonchi or rales.  Abdominal:     General: Bowel sounds are normal.     Palpations: Abdomen is soft.     Tenderness: There is no abdominal tenderness.  Musculoskeletal:     Cervical back: Normal range of motion.   Lymphadenopathy:     Cervical: No cervical adenopathy.  Skin:    General: Skin is warm and dry.  Neurological:  General: No focal deficit present.     Mental Status: She is alert and oriented to person, place, and time.  Psychiatric:        Mood and Affect: Mood normal.        Behavior: Behavior normal.      UC Treatments / Results  Labs (all labs ordered are listed, but only abnormal results are displayed) Labs Reviewed - No data to display  EKG   Radiology No results found.  Procedures Procedures (including critical care time)  Medications Ordered in UC Medications - No data to display  Initial Impression / Assessment and Plan / UC Course  I have reviewed the triage vital signs and the nursing notes.  Pertinent labs & imaging results that were available during my care of the patient were reviewed by me and considered in my medical decision making (see chart for details).  The patient is well-appearing, she is in no acute distress, she is mildly hypertensive and mildly tachycardic, but patient is speaking in complete sentences without difficulty.  Suspect a viral upper respiratory infection with cough.  Patient was prescribed prednisone 40 mg for the next 5 days, along with an albuterol inhaler for shortness of breath, and Promethazine DM to help with cough at nighttime.  Patient is scheduled for a dental procedure, patient was advised to follow-up with the dentist office to ensure the prednisone will not impact the procedure.  Supportive care recommendations were provided and discussed with the patient to include increasing fluids, allowing for plenty of rest, and use of a humidifier in her bedroom at nighttime during sleep.  Patient was advised that if symptoms do not improve over the next several days, would like for her to follow-up in this clinic for reevaluation of her symptoms.  Patient is in agreement with this plan of care and verbalizes understanding.  All  questions were answered.  Patient stable for discharge.   Final Clinical Impressions(s) / UC Diagnoses   Final diagnoses:  Viral upper respiratory tract infection with cough     Discharge Instructions      Take medication as prescribed. Increase fluids and allow for plenty of rest. Try to drink at least 8-10 8 ounce glasses of water daily while symptoms persist. May take over-the-counter Tylenol or ibuprofen as needed for pain, fever, or general discomfort. Recommend using a humidifier in your bedroom at nighttime during sleep and sleeping elevated on pillows while cough symptoms persist. As discussed, if the cough does not improve over the next week, or if you get new symptoms to include wheezing, fever rater than 100.4, chills, or new symptoms, please follow-up in this clinic for further evaluation. Follow-up as needed.     ED Prescriptions     Medication Sig Dispense Auth. Provider   albuterol (VENTOLIN HFA) 108 (90 Base) MCG/ACT inhaler Inhale 2 puffs into the lungs every 6 (six) hours as needed for wheezing or shortness of breath. 8 g Kristyana Notte-Warren, Alda Lea, NP   predniSONE (DELTASONE) 20 MG tablet Take 2 tablets (40 mg total) by mouth daily with breakfast for 5 days. 10 tablet Indio Santilli-Warren, Alda Lea, NP   promethazine-dextromethorphan (PROMETHAZINE-DM) 6.25-15 MG/5ML syrup Take 5 mLs by mouth 4 (four) times daily as needed. 118 mL Kmari Brian-Warren, Alda Lea, NP      PDMP not reviewed this encounter.   Tish Men, NP 10/15/22 2013

## 2022-10-16 ENCOUNTER — Encounter (HOSPITAL_COMMUNITY): Payer: Self-pay

## 2022-10-16 ENCOUNTER — Other Ambulatory Visit: Payer: Self-pay

## 2022-10-16 DIAGNOSIS — U071 COVID-19: Secondary | ICD-10-CM | POA: Insufficient documentation

## 2022-10-16 DIAGNOSIS — R0981 Nasal congestion: Secondary | ICD-10-CM | POA: Diagnosis present

## 2022-10-16 LAB — RESP PANEL BY RT-PCR (RSV, FLU A&B, COVID)  RVPGX2
Influenza A by PCR: NEGATIVE
Influenza B by PCR: NEGATIVE
Resp Syncytial Virus by PCR: NEGATIVE
SARS Coronavirus 2 by RT PCR: POSITIVE — AB

## 2022-10-16 NOTE — ED Triage Notes (Signed)
Pt states that she has been having SOB since Monday and went to UC and they prescribed her prednisone. Has not taken it because she says that she is scared it is going to make her heart rate high. Endorses fevers. Was not tested for covid or flu.

## 2022-10-17 ENCOUNTER — Emergency Department (HOSPITAL_COMMUNITY)
Admission: EM | Admit: 2022-10-17 | Discharge: 2022-10-17 | Disposition: A | Discharge status: Home / Self Care | Payer: Medicaid Other | Attending: Emergency Medicine | Admitting: Emergency Medicine

## 2022-10-17 DIAGNOSIS — U071 COVID-19: Secondary | ICD-10-CM

## 2022-10-17 MED ORDER — DEXAMETHASONE 4 MG PO TABS
16.0000 mg | ORAL_TABLET | Freq: Once | ORAL | Status: AC
  Administered 2022-10-17: 16 mg via ORAL
  Filled 2022-10-17: qty 4

## 2022-10-17 MED ORDER — KETOROLAC TROMETHAMINE 60 MG/2ML IM SOLN
30.0000 mg | Freq: Once | INTRAMUSCULAR | Status: AC
  Administered 2022-10-17: 30 mg via INTRAMUSCULAR
  Filled 2022-10-17: qty 2

## 2022-10-17 MED ORDER — IPRATROPIUM-ALBUTEROL 0.5-2.5 (3) MG/3ML IN SOLN
3.0000 mL | Freq: Once | RESPIRATORY_TRACT | Status: AC
  Administered 2022-10-17: 3 mL via RESPIRATORY_TRACT
  Filled 2022-10-17: qty 3

## 2022-10-17 NOTE — ED Provider Notes (Signed)
Charlene Key AT [facility] Provider Note   CSN: [id number] Arrival date & time: 10/16/22  2209     History  Chief Complaint  Patient presents with   Congestion, SOB    Charlene Key is a 32 y.o. female.  32 year old female that presents the ER today secondary to dyspnea.  Patient states this is been going on for about 5 days now.  She was seen at urgent care where no tests were done and was discharged on an inhaler and prednisone but she has pretty significant side effects to prednisone so she has not taken it.  Sounds like last time she had pretty severe sinus tachycardia requiring an ER visit.  Had a fever but not now.  Still with a cough.        Home Medications Prior to Admission medications   Medication Sig Start Date End Date Taking? Authorizing Provider  albuterol (VENTOLIN HFA) 108 (90 Base) MCG/ACT inhaler Inhale 2 puffs into the lungs every 6 (six) hours as needed for wheezing or shortness of breath. 10/15/22   Leath-Warren, Sadie Haber, NP  fluticasone-salmeterol (ADVAIR DISKUS) 250-50 MCG/ACT AEPB Inhale 1 puff into the lungs in the morning and at bedtime. Rinse mouth with water after each use Patient not taking: Reported on 08/11/2022 11/20/21   Particia Nearing, PA-C  ibuprofen (ADVIL) 400 MG tablet Take 1 tablet (400 mg total) by mouth every 8 (eight) hours as needed. 08/11/22   Burgess Amor, PA-C  methocarbamol (ROBAXIN) 500 MG tablet Take 1 tablet (500 mg total) by mouth every 8 (eight) hours as needed for muscle spasms. 08/11/22   Burgess Amor, PA-C  predniSONE (DELTASONE) 20 MG tablet Take 2 tablets (40 mg total) by mouth daily with breakfast for 5 days. 10/15/22 10/20/22  Leath-Warren, Sadie Haber, NP  promethazine-dextromethorphan (PROMETHAZINE-DM) 6.25-15 MG/5ML syrup Take 5 mLs by mouth 4 (four) times daily as needed. 10/15/22   Leath-Warren, Sadie Haber, NP      Allergies    Patient has no known allergies.    Review of Systems    Review of Systems  Physical Exam Updated Vital Signs BP (!) 128/90 (BP Location: Left Arm)   Pulse (!) 101   Temp 98.3 F (36.8 C) (Oral)   Resp (!) 21   Ht 5\' 3"  (1.6 m)   Wt 104.3 kg   LMP 06/10/2015   SpO2 100%   BMI 40.74 kg/m  Physical Exam Vitals and nursing note reviewed.  Constitutional:      Appearance: She is well-developed.  HENT:     Head: Normocephalic and atraumatic.     Mouth/Throat:     Mouth: Mucous membranes are moist.     Pharynx: Oropharynx is clear.  Eyes:     Pupils: Pupils are equal, round, and reactive to light.  Cardiovascular:     Rate and Rhythm: Normal rate and regular rhythm.  Pulmonary:     Effort: No respiratory distress.     Breath sounds: No stridor.  Abdominal:     General: Abdomen is flat. There is no distension.  Musculoskeletal:        General: No swelling or tenderness. Normal range of motion.     Cervical back: Normal range of motion.  Skin:    General: Skin is warm and dry.  Neurological:     General: No focal deficit present.     Mental Status: She is alert.     ED Results / Procedures / Treatments  Labs (all labs ordered are listed, but only abnormal results are displayed) Labs Reviewed  RESP PANEL BY RT-PCR (RSV, FLU A&B, COVID)  RVPGX2 - Abnormal; Notable for the following components:      Result Value   SARS Coronavirus 2 by RT PCR POSITIVE (*)    All other components within normal limits    EKG None  Radiology No results found.  Procedures Procedures    Medications Ordered in ED Medications  ipratropium-albuterol (DUONEB) 0.5-2.5 (3) MG/3ML nebulizer solution 3 mL (3 mLs Nebulization Given 10/17/22 0147)  dexamethasone (DECADRON) tablet 16 mg (16 mg Oral Given 10/17/22 0132)  ketorolac (TORADOL) injection 30 mg (30 mg Intramuscular Given 10/17/22 0130)    ED Course/ Medical Decision Making/ A&P                             Medical Decision Making Risk Prescription drug management.   Found to have  COVID.  DuoNeb tried which did not seem to help much with her diminished breath sounds.  No wheezing.  No rales.  No indication for CT scan.  Suspect this the COVID is causing her symptoms.  Her son sick with symptoms similar.  Overall she is probably the worst of her illness.  No indication for starting treatments.   Final Clinical Impression(s) / ED Diagnoses Final diagnoses:  COVID-19    Rx / DC Orders ED Discharge Orders     None         Shamekia Tippets, Barbara Cower, MD 10/17/22 [phone]

## 2022-11-14 DIAGNOSIS — R110 Nausea: Secondary | ICD-10-CM | POA: Insufficient documentation

## 2022-11-14 DIAGNOSIS — R197 Diarrhea, unspecified: Secondary | ICD-10-CM | POA: Insufficient documentation

## 2022-11-15 ENCOUNTER — Emergency Department (HOSPITAL_COMMUNITY)
Admission: EM | Admit: 2022-11-15 | Discharge: 2022-11-15 | Disposition: A | Discharge status: Home / Self Care | Payer: Medicaid Other | Attending: Emergency Medicine | Admitting: Emergency Medicine

## 2022-11-15 ENCOUNTER — Other Ambulatory Visit: Payer: Self-pay

## 2022-11-15 DIAGNOSIS — R197 Diarrhea, unspecified: Secondary | ICD-10-CM

## 2022-11-15 DIAGNOSIS — R110 Nausea: Secondary | ICD-10-CM

## 2022-11-15 LAB — COMPREHENSIVE METABOLIC PANEL
ALT: 27 U/L (ref 0–44)
AST: 19 U/L (ref 15–41)
Albumin: 4.1 g/dL (ref 3.5–5.0)
Alkaline Phosphatase: 86 U/L (ref 38–126)
Anion gap: 10 (ref 5–15)
BUN: 12 mg/dL (ref 6–20)
CO2: 25 mmol/L (ref 22–32)
Calcium: 9.1 mg/dL (ref 8.9–10.3)
Chloride: 100 mmol/L (ref 98–111)
Creatinine, Ser: 0.63 mg/dL (ref 0.44–1.00)
GFR, Estimated: >60 mL/min (ref >60)
Glucose, Bld: 123 mg/dL — ABNORMAL HIGH (ref 70–99)
Potassium: 3.6 mmol/L (ref 3.5–5.1)
Sodium: 135 mmol/L (ref 135–145)
Total Bilirubin: 0.3 mg/dL (ref 0.3–1.2)
Total Protein: 8 g/dL (ref 6.5–8.1)

## 2022-11-15 LAB — CBC
HCT: 43 % (ref 36.0–46.0)
Hemoglobin: 13.6 g/dL (ref 12.0–15.0)
MCH: 27.1 pg (ref 26.0–34.0)
MCHC: 31.6 g/dL (ref 30.0–36.0)
MCV: 85.7 fL (ref 80.0–100.0)
Platelets: 290 10*3/uL (ref 150–400)
RBC: 5.02 MIL/uL (ref 3.87–5.11)
RDW: 14.6 % (ref 11.5–15.5)
WBC: 9.4 10*3/uL (ref 4.0–10.5)
nRBC: 0 % (ref 0.0–0.2)

## 2022-11-15 LAB — URINALYSIS, ROUTINE W REFLEX MICROSCOPIC
Bilirubin Urine: NEGATIVE
Glucose, UA: NEGATIVE mg/dL
Hgb urine dipstick: NEGATIVE
Ketones, ur: NEGATIVE mg/dL
Leukocytes,Ua: NEGATIVE
Nitrite: NEGATIVE
Protein, ur: NEGATIVE mg/dL
Specific Gravity, Urine: 1.025 (ref 1.005–1.030)
pH: 5 (ref 5.0–8.0)

## 2022-11-15 LAB — LIPASE, BLOOD: Lipase: 26 U/L (ref 11–51)

## 2022-11-15 MED ORDER — LOPERAMIDE HCL 2 MG PO CAPS
4.0000 mg | ORAL_CAPSULE | Freq: Once | ORAL | Status: AC
  Administered 2022-11-15: 4 mg via ORAL
  Filled 2022-11-15: qty 2

## 2022-11-15 MED ORDER — ONDANSETRON HCL 4 MG/2ML IJ SOLN
4.0000 mg | Freq: Once | INTRAMUSCULAR | Status: AC
  Administered 2022-11-15: 4 mg via INTRAVENOUS
  Filled 2022-11-15: qty 2

## 2022-11-15 MED ORDER — ONDANSETRON 8 MG PO TBDP: 8.0000 mg | ORAL_TABLET | Freq: Three times a day (TID) | ORAL | 0 refills | Status: DC | PRN

## 2022-11-15 MED ORDER — SODIUM CHLORIDE 0.9 % IV BOLUS
1000.0000 mL | Freq: Once | INTRAVENOUS | Status: AC
  Administered 2022-11-15: 1000 mL via INTRAVENOUS

## 2022-11-15 NOTE — ED Triage Notes (Signed)
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

## 2022-11-15 NOTE — Discharge Instructions (Signed)
Take loperamide (Imodium A-D) as needed for diarrhea. ?

## 2022-11-15 NOTE — ED Provider Notes (Signed)
[place] EMERGENCY DEPARTMENT AT [facility] Provider Note   CSN: [id number] Arrival date & time: 11/14/22  2348     History  Chief Complaint  Patient presents with   Bloated   Nausea    Charlene Key is a 32 y.o. female.  The history is provided by the patient.  She is status post total hysterectomy and comes in complaining of nausea and diarrhea which started this morning.  She has not vomited, has had 2 loose bowel movements and continues to feel like she is going to have more diarrhea.  There has been some mild abdominal cramping but no true abdominal pain.  She denies fever, chills, sweats.  She denies any sick contacts.   Home Medications Prior to Admission medications   Medication Sig Start Date End Date Taking? Authorizing Provider  ondansetron (ZOFRAN-ODT) 8 MG disintegrating tablet Take 1 tablet (8 mg total) by mouth every 8 (eight) hours as needed for nausea or vomiting. 11/15/22  Yes Dione Booze, MD  albuterol (VENTOLIN HFA) 108 (90 Base) MCG/ACT inhaler Inhale 2 puffs into the lungs every 6 (six) hours as needed for wheezing or shortness of breath. 10/15/22   Leath-Warren, Sadie Haber, NP  fluticasone-salmeterol (ADVAIR DISKUS) 250-50 MCG/ACT AEPB Inhale 1 puff into the lungs in the morning and at bedtime. Rinse mouth with water after each use Patient not taking: Reported on 08/11/2022 11/20/21   Particia Nearing, PA-C  ibuprofen (ADVIL) 400 MG tablet Take 1 tablet (400 mg total) by mouth every 8 (eight) hours as needed. 08/11/22   Burgess Amor, PA-C  methocarbamol (ROBAXIN) 500 MG tablet Take 1 tablet (500 mg total) by mouth every 8 (eight) hours as needed for muscle spasms. 08/11/22   Burgess Amor, PA-C  promethazine-dextromethorphan (PROMETHAZINE-DM) 6.25-15 MG/5ML syrup Take 5 mLs by mouth 4 (four) times daily as needed. 10/15/22   Leath-Warren, Sadie Haber, NP      Allergies    Patient has no known allergies.    Review of Systems   Review of Systems  All  other systems reviewed and are negative.   Physical Exam Updated Vital Signs BP 133/69   Pulse 83   Temp 98.2 F (36.8 C) (Oral)   Resp 18   Ht 5\' 3"  (1.6 m)   Wt 105 kg   LMP 06/10/2015   SpO2 99%   BMI 41.01 kg/m  Physical Exam Vitals and nursing note reviewed.   32 year old female, resting comfortably and in no acute distress. Vital signs are normal. Oxygen saturation is 99%, which is normal. Head is normocephalic and atraumatic. PERRLA, EOMI. Oropharynx is clear.  Mucous membranes are moist. Neck is nontender and supple without adenopathy or JVD. Back is nontender and there is no CVA tenderness. Lungs are clear without rales, wheezes, or rhonchi. Chest is nontender. Heart has regular rate and rhythm without murmur. Abdomen is soft, flat, nontender. Extremities have no cyanosis or edema, full range of motion is present. Skin is warm and dry without rash. Neurologic: Mental status is normal, cranial nerves are intact, moves all extremities equally.  ED Results / Procedures / Treatments   Labs (all labs ordered are listed, but only abnormal results are displayed) Labs Reviewed  COMPREHENSIVE METABOLIC PANEL - Abnormal; Notable for the following components:      Result Value   Glucose, Bld 123 (*)    All other components within normal limits  URINALYSIS, ROUTINE W REFLEX MICROSCOPIC - Abnormal; Notable for the following  components:   APPearance HAZY (*)    All other components within normal limits  LIPASE, BLOOD  CBC    EKG None  Radiology No results found.  Procedures Procedures    Medications Ordered in ED Medications  ondansetron (ZOFRAN) injection 4 mg (has no administration in time range)  sodium chloride 0.9 % bolus 1,000 mL (has no administration in time range)  loperamide (IMODIUM) capsule 4 mg (has no administration in time range)    ED Course/ Medical Decision Making/ A&P                             Medical Decision Making Amount and/or  Complexity of Data Reviewed Labs: ordered.  Risk Prescription drug management.   Nausea and diarrhea which seems most likely to be viral gastroenteritis.  I have low index of suspicion for bowel obstruction, diverticulitis.  Exam is benign.  I have reviewed and interpreted her laboratory tests, and my interpretation is mild elevation of random glucose which has been present before most likely secondary to prediabetes but otherwise normal comprehensive metabolic panel, normal CBC, normal urinalysis including normal specific gravity.  Patient is not clinically dehydrated, but I did offer her the option of getting some IV fluids which she has requested.  I have ordered a dose of ondansetron and a liter of normal saline intravenously as well as a dose of oral loperamide.  I am discharging her with prescription for ondansetron oral dissolving tablet, advised to use over-the-counter loperamide as needed.  Final Clinical Impression(s) / ED Diagnoses Final diagnoses:  Nausea  Diarrhea of presumed infectious origin    Rx / DC Orders ED Discharge Orders          Ordered    ondansetron (ZOFRAN-ODT) 8 MG disintegrating tablet  Every 8 hours PRN        11/15/22 0244              Dione Booze, MD 11/15/22 [phone]

## 2022-11-27 ENCOUNTER — Emergency Department (HOSPITAL_COMMUNITY)
Admission: EM | Admit: 2022-11-27 | Discharge: 2022-11-27 | Disposition: A | Discharge status: Home / Self Care | Payer: Medicaid Other | Attending: Emergency Medicine | Admitting: Emergency Medicine

## 2022-11-27 DIAGNOSIS — Z5321 Procedure and treatment not carried out due to patient leaving prior to being seen by health care provider: Secondary | ICD-10-CM | POA: Diagnosis not present

## 2022-11-27 DIAGNOSIS — R109 Unspecified abdominal pain: Secondary | ICD-10-CM | POA: Diagnosis present

## 2022-11-27 NOTE — ED Triage Notes (Signed)
Pt decided to not be seen 

## 2022-11-28 ENCOUNTER — Encounter (HOSPITAL_COMMUNITY): Payer: Self-pay | Admitting: Emergency Medicine

## 2022-11-28 ENCOUNTER — Emergency Department (HOSPITAL_COMMUNITY)
Admission: EM | Admit: 2022-11-28 | Discharge: 2022-11-28 | Disposition: A | Discharge status: Home / Self Care | Payer: Medicaid Other | Attending: Emergency Medicine | Admitting: Emergency Medicine

## 2022-11-28 ENCOUNTER — Emergency Department (HOSPITAL_COMMUNITY): Payer: Medicaid Other

## 2022-11-28 ENCOUNTER — Other Ambulatory Visit: Payer: Self-pay

## 2022-11-28 DIAGNOSIS — R1013 Epigastric pain: Secondary | ICD-10-CM

## 2022-11-28 DIAGNOSIS — R109 Unspecified abdominal pain: Secondary | ICD-10-CM | POA: Diagnosis present

## 2022-11-28 LAB — GASTROINTESTINAL PANEL BY PCR, STOOL (REPLACES STOOL CULTURE)

## 2022-11-28 LAB — CBC WITH DIFFERENTIAL/PLATELET
Abs Immature Granulocytes: 0.02 10*3/uL (ref 0.00–0.07)
Basophils Absolute: 0 10*3/uL (ref 0.0–0.1)
Basophils Relative: 1 %
Eosinophils Absolute: 0.2 10*3/uL (ref 0.0–0.5)
Eosinophils Relative: 2 %
HCT: 41 % (ref 36.0–46.0)
Hemoglobin: 13.1 g/dL (ref 12.0–15.0)
Immature Granulocytes: 0 %
Lymphocytes Relative: 40 %
Lymphs Abs: 3.4 10*3/uL (ref 0.7–4.0)
MCH: 27 pg (ref 26.0–34.0)
MCHC: 32 g/dL (ref 30.0–36.0)
MCV: 84.5 fL (ref 80.0–100.0)
Monocytes Absolute: 0.6 10*3/uL (ref 0.1–1.0)
Monocytes Relative: 7 %
Neutro Abs: 4.4 10*3/uL (ref 1.7–7.7)
Neutrophils Relative %: 50 %
Platelets: 315 10*3/uL (ref 150–400)
RBC: 4.85 MIL/uL (ref 3.87–5.11)
RDW: 13.9 % (ref 11.5–15.5)
WBC: 8.7 10*3/uL (ref 4.0–10.5)
nRBC: 0 % (ref 0.0–0.2)

## 2022-11-28 LAB — URINALYSIS, ROUTINE W REFLEX MICROSCOPIC
Bilirubin Urine: NEGATIVE
Glucose, UA: NEGATIVE mg/dL
Hgb urine dipstick: NEGATIVE
Ketones, ur: NEGATIVE mg/dL
Leukocytes,Ua: NEGATIVE
Nitrite: NEGATIVE
Protein, ur: NEGATIVE mg/dL
Specific Gravity, Urine: 1.015 (ref 1.005–1.030)
pH: 6 (ref 5.0–8.0)

## 2022-11-28 LAB — COMPREHENSIVE METABOLIC PANEL
ALT: 28 U/L (ref 0–44)
AST: 15 U/L (ref 15–41)
Albumin: 3.7 g/dL (ref 3.5–5.0)
Alkaline Phosphatase: 67 U/L (ref 38–126)
Anion gap: 8 (ref 5–15)
BUN: 11 mg/dL (ref 6–20)
CO2: 27 mmol/L (ref 22–32)
Calcium: 8.8 mg/dL — ABNORMAL LOW (ref 8.9–10.3)
Chloride: 101 mmol/L (ref 98–111)
Creatinine, Ser: 0.57 mg/dL (ref 0.44–1.00)
GFR, Estimated: >60 mL/min (ref >60)
Glucose, Bld: 115 mg/dL — ABNORMAL HIGH (ref 70–99)
Potassium: 3.8 mmol/L (ref 3.5–5.1)
Sodium: 136 mmol/L (ref 135–145)
Total Bilirubin: 0.7 mg/dL (ref 0.3–1.2)
Total Protein: 7.5 g/dL (ref 6.5–8.1)

## 2022-11-28 LAB — POC OCCULT BLOOD, ED: Fecal Occult Bld: NEGATIVE

## 2022-11-28 LAB — LIPASE, BLOOD: Lipase: 22 U/L (ref 11–51)

## 2022-11-28 MED ORDER — PANTOPRAZOLE SODIUM 40 MG PO TBEC: 40.0000 mg | DELAYED_RELEASE_TABLET | Freq: Every day | ORAL | Status: DC

## 2022-11-28 MED ORDER — PANTOPRAZOLE SODIUM 20 MG PO TBEC: 20.0000 mg | DELAYED_RELEASE_TABLET | Freq: Every day | ORAL | 0 refills | Status: AC

## 2022-11-28 MED ORDER — PANTOPRAZOLE SODIUM 40 MG PO TBEC
40.0000 mg | DELAYED_RELEASE_TABLET | Freq: Every day | ORAL | Status: DC
  Administered 2022-11-28: 40 mg via ORAL
  Filled 2022-11-28: qty 1

## 2022-11-28 NOTE — ED Notes (Signed)
Patient transported to X-ray 

## 2022-11-28 NOTE — Discharge Instructions (Signed)

## 2022-11-28 NOTE — ED Provider Notes (Signed)
[place] EMERGENCY DEPARTMENT AT [facility] Provider Note   CSN: [id number] Arrival date & time: 11/28/22  0211     History  Chief Complaint  Patient presents with   Abdominal Pain    Charlene Key is a 32 y.o. female.  The history is provided by the patient.  Abdominal Pain Associated symptoms: no chest pain, no dysuria, no fever and no vaginal bleeding     Patient presents for multiple complaints.  She reports for at least 3 days she has had indigestion and upper abdominal pain that worsens with eating.  She reports she has been constipated but has since had bowel movements that appeared dark.  No bloody stools.  No vomiting.  No active chest pain or shortness of breath. Patient reports recent was in tooth extraction and has been taking NSAIDs for at least 3 days. Recently seen in the ER for similar symptoms and was placed on Imodium, but she reports she was not having diarrhea at that time  Home Medications Prior to Admission medications   Medication Sig Start Date End Date Taking? Authorizing Provider  pantoprazole (PROTONIX) 20 MG tablet Take 1 tablet (20 mg total) by mouth daily. 11/28/22  Yes Zadie Rhine, MD  albuterol (VENTOLIN HFA) 108 (90 Base) MCG/ACT inhaler Inhale 2 puffs into the lungs every 6 (six) hours as needed for wheezing or shortness of breath. 10/15/22   Leath-Warren, Sadie Haber, NP  fluticasone-salmeterol (ADVAIR DISKUS) 250-50 MCG/ACT AEPB Inhale 1 puff into the lungs in the morning and at bedtime. Rinse mouth with water after each use Patient not taking: Reported on 08/11/2022 11/20/21   Particia Nearing, PA-C      Allergies    Patient has no known allergies.    Review of Systems   Review of Systems  Constitutional:  Negative for fever.  Cardiovascular:  Negative for chest pain.  Gastrointestinal:  Positive for abdominal pain.  Genitourinary:  Negative for dysuria and vaginal bleeding.    Physical Exam Updated Vital Signs BP  (!) 138/96   Pulse 68   Temp 98.1 F (36.7 C) (Oral)   Resp 12   Ht 1.6 m (5\' 3" )   Wt 104.3 kg   LMP 06/10/2015   SpO2 97%   BMI 40.74 kg/m  Physical Exam CONSTITUTIONAL: Well developed/well nourished HEAD: Normocephalic/atraumatic EYES: EOMI/PERRL ENMT: Mucous membranes moist NECK: supple no meningeal signs CV: S1/S2 noted, no murmurs/rubs/gallops noted LUNGS: Lungs are clear to auscultation bilaterally, no apparent distress ABDOMEN: soft, obese, mild epigastric tenderness, no rebound or guarding, bowel sounds noted throughout abdomen NEURO: Pt is awake/alert/appropriate, moves all extremitiesx4.  No facial droop.   EXTREMITIES: pulses normal/equal, full ROM SKIN: warm, color normal PSYCH: no abnormalities of mood noted, alert and oriented to situation  ED Results / Procedures / Treatments   Labs (all labs ordered are listed, but only abnormal results are displayed) Labs Reviewed  COMPREHENSIVE METABOLIC PANEL - Abnormal; Notable for the following components:      Result Value   Glucose, Bld 115 (*)    Calcium 8.8 (*)    All other components within normal limits  GASTROINTESTINAL PANEL BY PCR, STOOL (REPLACES STOOL CULTURE)  URINALYSIS, ROUTINE W REFLEX MICROSCOPIC  CBC WITH DIFFERENTIAL/PLATELET  LIPASE, BLOOD  POC OCCULT BLOOD, ED    EKG EKG Interpretation  Date/Time:  Saturday Nov 28 2022 03:09:36 EDT Ventricular Rate:  81 PR Interval:  171 QRS Duration: 82 QT Interval:  364 QTC Calculation: 423 R  Axis:   44 Text Interpretation: Sinus rhythm Confirmed by Zadie Rhine ([id number]) on 11/28/2022 3:14:53 AM  Radiology DG Abdomen Acute W/Chest  Result Date: 11/28/2022 CLINICAL DATA:  Abdominal pain and low-grade fever. EXAM: DG ABDOMEN ACUTE WITH 1 VIEW CHEST COMPARISON:  CT abdomen 08/25/2021, PA Lat chest 06/06/2021 FINDINGS: There is no evidence of dilated bowel loops or free intraperitoneal air. No radiopaque calculi or other significant radiographic  abnormality is seen. Heart size and mediastinal contours are within normal limits. Both lungs are clear. Multiple overlying monitor wires. IMPRESSION: Negative abdominal radiographs. No acute cardiopulmonary findings. Compare: Unchanged. Electronically Signed   By: Almira Bar M.D.   On: 11/28/2022 05:05    Procedures Procedures    Medications Ordered in ED Medications  pantoprazole (PROTONIX) EC tablet 40 mg (40 mg Oral Given 11/28/22 0317)    ED Course/ Medical Decision Making/ A&P Clinical Course as of 11/28/22 0529  Sat Nov 28, 2022  6045 Patient presents for multiple complaints.  Reports ongoing upper abdominal pain that was worse in the past several days after taking NSAIDs.  Also reports constipation followed by dark stools.  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  She request stool studies as well. [DW]  0441 Labs were overall unremarkable.  Patient is concern for bowel obstruction.  Will obtain abdominal x-ray [DW]  0529 Overall workup is reassuring No vomiting Xray is negative Will d/c home Counseled on appropriate use of laxatives Told her to avoid enemas Will start PPI F/u with GI in 2 weeks if no improvement in upper ABD pain and indigestion Told her to avoid NSAIDs [DW]    Clinical Course User Index [DW] Zadie Rhine, MD                             Medical Decision Making Amount and/or Complexity of Data Reviewed Labs: ordered. Radiology: ordered. ECG/medicine tests: ordered.  Risk Prescription drug management.   This patient presents to the ED for concern of abdominal pain, this involves an extensive number of treatment options, and is a complaint that carries with it a high risk of complications and morbidity.  The differential diagnosis includes but is not limited to cholecystitis, cholelithiasis, pancreatitis, gastritis, peptic ulcer disease, appendicitis, bowel obstruction, bowel perforation, diverticulitis, AAA,  ischemic bowel  Additional history obtained: Additional history obtained from spouse Records reviewed  outpatient records reviewed  Lab Tests: I Ordered, and personally interpreted labs.  The pertinent results include:  labs unremarkable  Imaging Studies ordered: I ordered imaging studies including X-ray acute abdominal series   I independently visualized and interpreted imaging which showed no acute findings I agree with the radiologist interpretation   Medicines ordered and prescription drug management: I ordered medication including Protonix for epigastric pain Reevaluation of the patient after these medicines showed that the patient    stayed the same   Reevaluation: After the interventions noted above, I reevaluated the patient and found that they have :stayed the same  Complexity of problems addressed: Patient's presentation is most consistent with  acute presentation with potential threat to life or bodily function  Disposition: After consideration of the diagnostic results and the patient's response to treatment,  I feel that the patent would benefit from discharge   .           Final Clinical Impression(s) / ED Diagnoses Final diagnoses:  Epigastric pain    Rx /  DC Orders ED Discharge Orders          Ordered    pantoprazole (PROTONIX) 20 MG tablet  Daily        11/28/22 0528              Zadie Rhine, MD 11/28/22 0530

## 2022-11-28 NOTE — ED Triage Notes (Signed)
C/o stomach pains with indigestion last 3 days. Last bowel movement yesterday. States that when eats feels nauseas, dizzy, and fatigued with low grade fever. Denies any emesis, diarrhea.

## 2023-01-22 ENCOUNTER — Other Ambulatory Visit: Payer: Self-pay | Admitting: Internal Medicine

## 2023-01-23 LAB — COMPLETE METABOLIC PANEL WITH GFR
AG Ratio: 1.4 (calc) (ref 1.0–2.5)
ALT: 26 U/L (ref 6–29)
AST: 19 U/L (ref 10–30)
Albumin: 4.5 g/dL (ref 3.6–5.1)
Alkaline phosphatase (APISO): 89 U/L (ref 31–125)
BUN: 13 mg/dL (ref 7–25)
CO2: 22 mmol/L (ref 20–32)
Calcium: 9.5 mg/dL (ref 8.6–10.2)
Chloride: 102 mmol/L (ref 98–110)
Creat: 0.76 mg/dL (ref 0.50–0.97)
Globulin: 3.2 g/dL (calc) (ref 1.9–3.7)
Glucose, Bld: 94 mg/dL (ref 65–99)
Potassium: 4.5 mmol/L (ref 3.5–5.3)
Sodium: 136 mmol/L (ref 135–146)
Total Bilirubin: 0.2 mg/dL (ref 0.2–1.2)
Total Protein: 7.7 g/dL (ref 6.1–8.1)
eGFR: 105 mL/min/{1.73_m2} (ref >=60)

## 2023-01-23 LAB — CBC
HCT: 42.4 % (ref 35.0–45.0)
Hemoglobin: 13.6 g/dL (ref 11.7–15.5)
MCH: 27.2 pg (ref 27.0–33.0)
MCHC: 32.1 g/dL (ref 32.0–36.0)
MCV: 84.8 fL (ref 80.0–100.0)
MPV: 10.7 fL (ref 7.5–12.5)
Platelets: 336 10*3/uL (ref 140–400)
RBC: 5 10*6/uL (ref 3.80–5.10)
RDW: 13 % (ref 11.0–15.0)
WBC: 7.3 10*3/uL (ref 3.8–10.8)

## 2023-01-23 LAB — VITAMIN D 25 HYDROXY (VIT D DEFICIENCY, FRACTURES): Vit D, 25-Hydroxy: 36 ng/mL (ref 30–100)

## 2023-01-23 LAB — LIPID PANEL
Cholesterol: 168 mg/dL (ref <200)
HDL: 65 mg/dL (ref >=50)
LDL Cholesterol (Calc): 80 mg/dL (calc)
Non-HDL Cholesterol (Calc): 103 mg/dL (calc) (ref <130)
Total CHOL/HDL Ratio: 2.6 (calc) (ref <5.0)
Triglycerides: 129 mg/dL (ref <150)

## 2023-01-23 LAB — TSH: TSH: 0.53 mIU/L

## 2023-07-17 ENCOUNTER — Other Ambulatory Visit: Payer: Self-pay

## 2023-07-17 ENCOUNTER — Emergency Department (HOSPITAL_COMMUNITY)
Admission: EM | Admit: 2023-07-17 | Discharge: 2023-07-18 | Disposition: A | Discharge status: Home / Self Care | Payer: Medicaid Other | Attending: Emergency Medicine | Admitting: Emergency Medicine

## 2023-07-17 ENCOUNTER — Encounter (HOSPITAL_COMMUNITY): Payer: Self-pay

## 2023-07-17 DIAGNOSIS — M25462 Effusion, left knee: Secondary | ICD-10-CM | POA: Diagnosis present

## 2023-07-17 DIAGNOSIS — M25562 Pain in left knee: Secondary | ICD-10-CM | POA: Insufficient documentation

## 2023-07-17 DIAGNOSIS — W108XXA Fall (on) (from) other stairs and steps, initial encounter: Secondary | ICD-10-CM | POA: Insufficient documentation

## 2023-07-17 NOTE — ED Triage Notes (Signed)
 Walking down stairs and tripped landing on left knee.   Denies pain but reports subjective swelling and pressure feeling.

## 2023-07-18 ENCOUNTER — Emergency Department (HOSPITAL_COMMUNITY): Payer: Medicaid Other

## 2023-07-18 MED ORDER — IBUPROFEN 800 MG PO TABS
800.0000 mg | ORAL_TABLET | Freq: Once | ORAL | Status: AC
  Administered 2023-07-18: 800 mg via ORAL
  Filled 2023-07-18: qty 1

## 2023-07-18 NOTE — Discharge Instructions (Addendum)
 There are no broken bones on your xray. You have likely strained your knee. You may use over the counter medications as needed and use the provided brace for support. Follow up with your primary care and return to the ER with any new severe symptoms.

## 2023-07-18 NOTE — ED Notes (Addendum)
 Discharge instructions reviewed.   Opportunity for questions and concerns provided.   Alert, oriented and ambulatory. Ortho devices explained and pt verbalizes understanding.   Encouraged to use over the counter medications for pain management.

## 2023-07-18 NOTE — ED Provider Notes (Signed)
 [place] EMERGENCY DEPARTMENT AT [facility] Provider Note   CSN: [id number] Arrival date & time: 07/17/23  2337     History  Chief Complaint  Patient presents with   [place]    Charlene Key is a 33 y.o. female who had a mechanical fall when coming down the stairs, fell directly down onto her knees on the concrete. Swelling in the left knee, feels tight, pain 2/10. Ambulatory. NO hx of injury to the knee in the past.   HPI     Home Medications Prior to Admission medications   Medication Sig Start Date End Date Taking? Authorizing Provider  albuterol  (VENTOLIN  HFA) 108 (90 Base) MCG/ACT inhaler Inhale 2 puffs into the lungs every 6 (six) hours as needed for wheezing or shortness of breath. 10/15/22   Leath-Warren, Etta PARAS, NP  fluticasone -salmeterol (ADVAIR DISKUS) 250-50 MCG/ACT AEPB Inhale 1 puff into the lungs in the morning and at bedtime. Rinse mouth with water after each use Patient not taking: Reported on 08/11/2022 11/20/21   Stuart Vernell Norris, PA-C  pantoprazole  (PROTONIX ) 20 MG tablet Take 1 tablet (20 mg total) by mouth daily. 11/28/22   Midge Golas, MD      Allergies    Patient has no known allergies.    Review of Systems   Review of Systems  Musculoskeletal:        Left knee pain    Physical Exam Updated Vital Signs BP (!) 156/99   Pulse 93   Temp 98.3 F (36.8 C)   Resp 18   Ht 5' 3 (1.6 m)   Wt 106.1 kg   LMP 06/10/2015   SpO2 100%   BMI 41.45 kg/m  Physical Exam Vitals and nursing note reviewed.  Constitutional:      Appearance: She is obese. She is not ill-appearing or toxic-appearing.  HENT:     Head: Normocephalic and atraumatic.  Eyes:     General: No scleral icterus.       Right eye: No discharge.        Left eye: No discharge.     Conjunctiva/sclera: Conjunctivae normal.  Pulmonary:     Effort: Pulmonary effort is normal.  Musculoskeletal:     Right hip: Normal.     Left hip: Normal.     Right upper leg:  Normal.     Left upper leg: Normal.     Right knee: Normal.     Left knee: Swelling present. No deformity, erythema, ecchymosis or bony tenderness. Normal range of motion. No tenderness.     Right lower leg: Normal.     Left lower leg: Normal.     Right ankle: Normal.     Left ankle: Normal.     Right foot: Normal.     Left foot: Normal.  Skin:    General: Skin is warm and dry.  Neurological:     General: No focal deficit present.     Mental Status: She is alert.  Psychiatric:        Mood and Affect: Mood normal.     ED Results / Procedures / Treatments   Labs (all labs ordered are listed, but only abnormal results are displayed) Labs Reviewed - No data to display  EKG None  Radiology DG Knee Complete 4 Views Left Result Date: 07/18/2023 CLINICAL DATA:  Fall down stairs with left knee pain, initial encounter EXAM: LEFT KNEE - COMPLETE 4+ VIEW COMPARISON:  None Available. FINDINGS: No evidence of fracture, dislocation,  or joint effusion. No evidence of arthropathy or other focal bone abnormality. Soft tissues are unremarkable. IMPRESSION: No acute abnormality noted. Electronically Signed   By: Oneil Devonshire M.D.   On: 07/18/2023 00:24    Procedures Procedures    Medications Ordered in ED Medications  ibuprofen  (ADVIL ) tablet 800 mg (has no administration in time range)    ED Course/ Medical Decision Making/ A&P                                 Medical Decision Making 33 y/o female, mechanical fall with knee swelling.   HTN on intake, VS otherwise normal. Obese, left knee swelling minimal but present. No deformity, bruising or laceration. FROM actively, without pain. Normal vascular status.   Amount and/or Complexity of Data Reviewed Radiology: ordered.    Details: Xray negative for acute osseous abnormality   Risk Prescription drug management.   Knee brace offered in the ED. Suspect acute ligamentous strain versus contusion. Clinical concern for emergent  underlying etiology that would warrant further ED workup or inpatient management is exceedingly low.   Charlene Key This chart was dictated using chemical engineer, Nurse, [facility]. Despite the best efforts of this provider to proofread and correct errors, errors may still occur which can change documentation meaning.  This chart was dictated using voice recognition software, Dragon. Despite the best efforts of this provider to proofread and correct errors, errors may still occur which can change documentation meaning.         Final Clinical Impression(s) / ED Diagnoses Final diagnoses:  Acute pain of left knee    Rx / DC Orders ED Discharge Orders     None         Bobette Pleasant JONELLE DEVONNA 07/18/23 0132    Trine Raynell Moder, MD 07/18/23 2037

## 2023-07-22 ENCOUNTER — Encounter (HOSPITAL_BASED_OUTPATIENT_CLINIC_OR_DEPARTMENT_OTHER): Payer: Self-pay | Admitting: Orthopedic Surgery

## 2023-07-22 ENCOUNTER — Other Ambulatory Visit: Payer: Self-pay

## 2023-07-28 ENCOUNTER — Encounter (HOSPITAL_BASED_OUTPATIENT_CLINIC_OR_DEPARTMENT_OTHER): Payer: Self-pay | Admitting: Orthopedic Surgery

## 2023-07-28 ENCOUNTER — Encounter (HOSPITAL_COMMUNITY): Payer: Self-pay | Admitting: Certified Registered Nurse Anesthetist

## 2023-07-28 ENCOUNTER — Ambulatory Visit (HOSPITAL_BASED_OUTPATIENT_CLINIC_OR_DEPARTMENT_OTHER): Admission: RE | Admit: 2023-07-28 | Payer: Medicaid Other | Source: Home / Self Care | Admitting: Orthopedic Surgery

## 2023-07-28 HISTORY — DX: Unspecified asthma, uncomplicated: J45.909

## 2023-07-28 HISTORY — DX: Gestational (pregnancy-induced) hypertension without significant proteinuria, unspecified trimester: O13.9

## 2023-07-28 HISTORY — DX: Herpesviral infection, unspecified: B00.9

## 2023-07-28 SURGERY — CARPAL TUNNEL RELEASE
Anesthesia: Monitor Anesthesia Care | Laterality: Right

## 2023-07-28 MED ORDER — LIDOCAINE HCL (PF) 1 % IJ SOLN
INTRAMUSCULAR | Status: AC
  Filled 2023-07-28: qty 30

## 2023-07-28 MED ORDER — FENTANYL CITRATE (PF) 100 MCG/2ML IJ SOLN
INTRAMUSCULAR | Status: AC
  Filled 2023-07-28: qty 2

## 2023-07-28 MED ORDER — ONDANSETRON HCL 4 MG/2ML IJ SOLN
INTRAMUSCULAR | Status: AC
  Filled 2023-07-28: qty 2

## 2023-07-28 MED ORDER — MIDAZOLAM HCL 2 MG/2ML IJ SOLN
INTRAMUSCULAR | Status: AC
  Filled 2023-07-28: qty 2

## 2023-07-28 MED ORDER — BUPIVACAINE HCL (PF) 0.25 % IJ SOLN
INTRAMUSCULAR | Status: AC
  Filled 2023-07-28: qty 30

## 2023-07-28 NOTE — Anesthesia Preprocedure Evaluation (Signed)
 Anesthesia Evaluation    Reviewed: Allergy & Precautions, Patient's Chart, lab work & pertinent test results  History of Anesthesia Complications Negative for: history of anesthetic complications  Airway        Dental   Pulmonary asthma           Cardiovascular hypertension,      Neuro/Psych negative neurological ROS     GI/Hepatic Neg liver ROS,GERD  Medicated,,  Endo/Other  negative endocrine ROS    Renal/GU negative Renal ROS  negative genitourinary   Musculoskeletal negative musculoskeletal ROS (+)    Abdominal   Peds  Hematology negative hematology ROS (+)   Anesthesia Other Findings Right carpal tunnel syndrome  Reproductive/Obstetrics negative OB ROS                             Anesthesia Physical Anesthesia Plan  ASA: 2  Anesthesia Plan: MAC   Post-op Pain Management: Tylenol  PO (pre-op)*   Induction:   PONV Risk Score and Plan: 2 and Treatment may vary due to age or medical condition, Propofol  infusion and Midazolam   Airway Management Planned: Natural Airway and Nasal Cannula  Additional Equipment: None  Intra-op Plan:   Post-operative Plan:   Informed Consent:   Plan Discussed with:   Anesthesia Plan Comments:        Anesthesia Quick Evaluation

## 2024-05-08 ENCOUNTER — Encounter (HOSPITAL_COMMUNITY): Payer: Self-pay

## 2024-05-08 ENCOUNTER — Other Ambulatory Visit: Payer: Self-pay

## 2024-05-08 ENCOUNTER — Emergency Department (HOSPITAL_COMMUNITY)

## 2024-05-08 ENCOUNTER — Emergency Department (HOSPITAL_COMMUNITY)
Admission: EM | Admit: 2024-05-08 | Discharge: 2024-05-09 | Disposition: A | Discharge status: Home / Self Care | Attending: Emergency Medicine | Admitting: Emergency Medicine

## 2024-05-08 DIAGNOSIS — R0602 Shortness of breath: Secondary | ICD-10-CM | POA: Diagnosis present

## 2024-05-08 DIAGNOSIS — J40 Bronchitis, not specified as acute or chronic: Secondary | ICD-10-CM | POA: Diagnosis not present

## 2024-05-08 LAB — CBC
HCT: 40.1 % (ref 36.0–46.0)
Hemoglobin: 12.6 g/dL (ref 12.0–15.0)
MCH: 27 pg (ref 26.0–34.0)
MCHC: 31.4 g/dL (ref 30.0–36.0)
MCV: 86.1 fL (ref 80.0–100.0)
Platelets: 302 K/uL (ref 150–400)
RBC: 4.66 MIL/uL (ref 3.87–5.11)
RDW: 14.1 % (ref 11.5–15.5)
WBC: 10.9 K/uL — ABNORMAL HIGH (ref 4.0–10.5)
nRBC: 0 % (ref 0.0–0.2)

## 2024-05-08 MED ORDER — PREDNISONE 20 MG PO TABS: 40.0000 mg | ORAL_TABLET | Freq: Every day | ORAL | 0 refills | Status: DC

## 2024-05-08 NOTE — ED Provider Notes (Signed)
 [facility]ERGENCY DEPT [facility] Emergency Department Provider Note MRN:  [id number]  Arrival date & time: 05/08/24     Chief Complaint   Shortness of Breath and Cough   History of Present Illness   Charlene Key is a 33 y.o. year-old female with a history of asthma presenting to the ED with chief complaint of shortness of breath.  2 days ago patient got sick with fever and cough, has been having some shortness of breath with this illness.  Explains that she has shortness of breath fairly often and needs to use her inhaler about 2 or 3 times a week.  Denies chest pain.  Having some back soreness but only when coughing.  Review of Systems  A thorough review of systems was obtained and all systems are negative except as noted in the HPI and PMH.   Patient's Health History    Past Medical History:  Diagnosis Date   Asthma    HSV-2 infection complicating pregnancy    PIH (pregnancy induced hypertension)     Past Surgical History:  Procedure Laterality Date   ABDOMINAL HYSTERECTOMY Bilateral 01/12/2017   Procedure: HYSTERECTOMY ABDOMINAL BILATERAL SALPINGECTOMY;  Surgeon: Schermerhorn, Debby PARAS, MD;  Location: [facility] ORS;  Service: Gynecology;  Laterality: Bilateral;   CERVICAL CERCLAGE     CESAREAN SECTION      Family History  Problem Relation Age of Onset   Diabetes Mother     Social History   Socioeconomic History   Marital status: Married    Spouse name: Not on file   Number of children: Not on file   Years of education: Not on file   Highest education level: Not on file  Occupational History   Not on file  Tobacco Use   Smoking status: Never   Smokeless tobacco: Never  Vaping Use   Vaping status: Never Used  Substance and Sexual Activity   Alcohol use: Never   Drug use: No   Sexual activity: Yes    Birth control/protection: None  Other Topics Concern   Not on file  Social History Narrative   Not on file   Social Drivers of Health   Financial Resource  Strain: Low Risk  (03/25/2022)   Received from [facility]   Overall Financial Resource Strain (CARDIA)    Difficulty of Paying Living Expenses: Not very hard  Food Insecurity: No Food Insecurity (03/25/2022)   Received from [facility]   Hunger Vital Sign    Within the past 12 months, you worried that your food would run out before you got the money to buy more.: Never true    Within the past 12 months, the food you bought just didn't last and you didn't have money to get more.: Never true  Transportation Needs: No Transportation Needs (03/25/2022)   Received from [facility]   PRAPARE - Transportation    Lack of Transportation (Medical): No    Lack of Transportation (Non-Medical): No  Physical Activity: Not on file  Stress: Not on file  Social Connections: Not on file  Intimate Partner Violence: Not At Risk (03/25/2022)   Received from [facility]   Humiliation, Afraid, Rape, and Kick questionnaire    Within the last year, have you been afraid of your partner or ex-partner?: No    Within the last year, have you been humiliated or emotionally abused in other ways by your partner or ex-partner?: No    Within the last year, have you been  kicked, hit, slapped, or otherwise physically hurt by your partner or ex-partner?: No    Within the last year, have you been raped or forced to have any kind of sexual activity by your partner or ex-partner?: No     Physical Exam   Vitals:   05/08/24 2238  BP: (!) 150/96  Pulse: 88  Resp: 18  Temp: 98.6 F (37 C)  SpO2: 100%    CONSTITUTIONAL: Well-appearing, NAD NEURO/PSYCH:  Alert and oriented x 3, no focal deficits EYES:  eyes equal and reactive ENT/NECK:  no LAD, no JVD CARDIO: Regular rate, well-perfused, normal S1 and S2 PULM:  CTAB no wheezing or rhonchi GI/GU:  non-distended, non-tender MSK/SPINE:  No gross deformities, no edema SKIN:  no rash, atraumatic   *Additional and/or pertinent findings included in MDM  below  Diagnostic and Interventional Summary    EKG Interpretation Date/Time:  Monday May 08 2024 23:05:45 EDT Ventricular Rate:  82 PR Interval:  163 QRS Duration:  81 QT Interval:  357 QTC Calculation: 417 R Axis:   27  Text Interpretation: Sinus rhythm Confirmed by Theadore Sharper [phone]) on 05/08/2024 11:14:26 PM       Labs Reviewed  CBC - Abnormal; Notable for the following components:      Result Value   WBC 10.9 (*)    All other components within normal limits  RESP PANEL BY RT-PCR (RSV, FLU A&B, COVID)  [id number]  BASIC METABOLIC PANEL WITH GFR    DG Chest 2 View  Final Result      Medications - No data to display   Procedures  /  Critical Care Procedures  ED Course and Medical Decision Making  Initial Impression and Ddx Suspect viral illness, bronchitis, other consideration would be bacterial pneumonia.  No leg pain or swelling, no hypoxia or tachycardia, doubt PE.  Past medical/surgical history that increases complexity of ED encounter: Asthma  Interpretation of Diagnostics I personally reviewed the Chest Xray and my interpretation is as follows: No lobar opacity or pneumothorax  No significant blood count or electrolyte disturbance.  Patient Reassessment and Ultimate Disposition/Management     Given patient's history of asthma will provide a course of steroids.  Appropriate for discharge.  Patient management required discussion with the following services or consulting groups:  None  Complexity of Problems Addressed Acute illness or injury that poses threat of life of bodily function  Additional Data Reviewed and Analyzed Further history obtained from: Further history from spouse/family member  Additional Factors Impacting ED Encounter Risk Prescriptions  Sharper HERO. Theadore, MD [facility] Emergency [facility] [email or web address]  Final Clinical Impressions(s) / ED Diagnoses     ICD-10-CM   1. Bronchitis  J40        ED Discharge Orders          Ordered    predniSONE  (DELTASONE ) 20 MG tablet  Daily        05/08/24 2351             Discharge Instructions Discussed with and Provided to Patient:     Discharge Instructions      You were evaluated in the Emergency Department and after careful evaluation, we did not find any emergent condition requiring admission or further testing in the hospital.  Your exam/testing today is overall reassuring.  Symptoms likely due to bronchitis.  Continue your inhaler at home.  Continue Tylenol  and Motrin  for discomfort.  Recommend use of the prednisone  steroid medication daily as prescribed.  Please return to the Emergency Department if you experience any worsening of your condition.   Thank you for allowing us  to be a part of your care.       Theadore Ozell HERO, MD 05/08/24 2351

## 2024-05-08 NOTE — ED Triage Notes (Signed)
 Pt. Arrives pov c/o sob, congestion and a cough. State that she is having back pain and nausea as well. Breathing is unlabored, even chest rise.

## 2024-05-08 NOTE — Discharge Instructions (Addendum)
 You were evaluated in the Emergency Department and after careful evaluation, we did not find any emergent condition requiring admission or further testing in the hospital.  Your exam/testing today is overall reassuring.  Symptoms likely due to bronchitis.  Recommend using MyChart to follow-up on your COVID flu and RSV testing.  Continue your inhaler at home.  Continue Tylenol  and Motrin  for discomfort.  Recommend use of the prednisone  steroid medication daily as prescribed.  Please return to the Emergency Department if you experience any worsening of your condition.   Thank you for allowing us  to be a part of your care.

## 2024-05-09 LAB — RESP PANEL BY RT-PCR (RSV, FLU A&B, COVID)  RVPGX2
Influenza A by PCR: NEGATIVE
Influenza B by PCR: NEGATIVE
Resp Syncytial Virus by PCR: NEGATIVE
SARS Coronavirus 2 by RT PCR: NEGATIVE

## 2024-05-09 LAB — BASIC METABOLIC PANEL WITH GFR
Anion gap: 9 (ref 5–15)
BUN: 14 mg/dL (ref 6–20)
CO2: 28 mmol/L (ref 22–32)
Calcium: 9.2 mg/dL (ref 8.9–10.3)
Chloride: 100 mmol/L (ref 98–111)
Creatinine, Ser: 0.67 mg/dL (ref 0.44–1.00)
GFR, Estimated: >60 mL/min (ref >60)
Glucose, Bld: 119 mg/dL — ABNORMAL HIGH (ref 70–99)
Potassium: 3.7 mmol/L (ref 3.5–5.1)
Sodium: 137 mmol/L (ref 135–145)

## 2024-05-09 MED ORDER — PREDNISONE 20 MG PO TABS: 40.0000 mg | ORAL_TABLET | Freq: Every day | ORAL | 0 refills | Status: AC

## 2024-07-07 ENCOUNTER — Encounter (HOSPITAL_COMMUNITY): Payer: Self-pay

## 2024-07-07 ENCOUNTER — Ambulatory Visit (HOSPITAL_COMMUNITY)
Admission: EM | Admit: 2024-07-07 | Discharge: 2024-07-07 | Disposition: A | Discharge status: Home / Self Care | Attending: Internal Medicine | Admitting: Internal Medicine

## 2024-07-07 DIAGNOSIS — E860 Dehydration: Secondary | ICD-10-CM | POA: Insufficient documentation

## 2024-07-07 DIAGNOSIS — R002 Palpitations: Secondary | ICD-10-CM | POA: Diagnosis present

## 2024-07-07 LAB — BASIC METABOLIC PANEL WITH GFR
Anion gap: 13 (ref 5–15)
BUN: 10 mg/dL (ref 6–20)
CO2: 25 mmol/L (ref 22–32)
Calcium: 9.6 mg/dL (ref 8.9–10.3)
Chloride: 98 mmol/L (ref 98–111)
Creatinine, Ser: 0.72 mg/dL (ref 0.44–1.00)
GFR, Estimated: >60 mL/min
Glucose, Bld: 77 mg/dL (ref 70–99)
Potassium: 4.1 mmol/L (ref 3.5–5.1)
Sodium: 136 mmol/L (ref 135–145)

## 2024-07-07 LAB — CBC
HCT: 48.5 % — ABNORMAL HIGH (ref 36.0–46.0)
Hemoglobin: 15.5 g/dL — ABNORMAL HIGH (ref 12.0–15.0)
MCH: 27.4 pg (ref 26.0–34.0)
MCHC: 32 g/dL (ref 30.0–36.0)
MCV: 85.7 fL (ref 80.0–100.0)
Platelets: 330 K/uL (ref 150–400)
RBC: 5.66 MIL/uL — ABNORMAL HIGH (ref 3.87–5.11)
RDW: 14.3 % (ref 11.5–15.5)
WBC: 6 K/uL (ref 4.0–10.5)
nRBC: 0 % (ref 0.0–0.2)

## 2024-07-07 LAB — POC COVID19/FLU A&B COMBO
Covid Antigen, POC: NEGATIVE
Influenza A Antigen, POC: NEGATIVE
Influenza B Antigen, POC: NEGATIVE

## 2024-07-07 LAB — TSH: TSH: 0.508 u[IU]/mL (ref 0.350–4.500)

## 2024-07-07 MED ORDER — ONDANSETRON 4 MG PO TBDP: 4.0000 mg | ORAL_TABLET | Freq: Three times a day (TID) | ORAL | 0 refills | Status: AC | PRN

## 2024-07-07 NOTE — Discharge Instructions (Signed)
 Your EKG looks good today, heart rate has come down to normal.  Blood work is pending, staff will call if blood work is abnormal. I suspect your symptoms are due to dehydration and caffeine intake, though I am also checking for other metabolic problems with your body including thyroid  problems which could also be causing heart palpitations.   Please decrease the amount of caffeine you are drinking and ensure you are drinking at least 64 ounces of water per day (8 whole cups). Drink even MORE water if you are drinking any beverages with caffeine (sweet tea, energy drink, coffee, etc) or any sugary drinks.   Please make sure you are eating regularly and are not skipping meals.   Take zofran  every 8 hours as needed for nausea/vomiting.  Follow-up with your primary care provider to discuss symptoms further in about a week.  Go to the ER if your heart palpitations do not stop, if you develop chest pain, worsening shortness of breath, dizziness, if you pass out, or if you have persistent vomiting and diarrhea.

## 2024-07-07 NOTE — ED Triage Notes (Signed)
 Patient states her lips are tingling and she feel numb.

## 2024-07-07 NOTE — ED Provider Notes (Signed)
 " [facility]    CSN: [id number] Arrival date & time: 07/07/24  1035      History   Chief Complaint No chief complaint on file.   HPI Charlene Key is a 33 y.o. female.   Charlene Key is a 33 y.o. female presenting for chief complaint of heart palpitations and shortness of breath that started today while she was moving around.  She was not performing strenuous activity when her symptoms began.  She felt heart palpitations for a few minutes and felt skipped beats in her chest which caused her to catch her breath a couple of times.  She also states both sides of her mouth became momentarily numb/tingly when she was experiencing heart palpitations and she became nauseous/had 1 episode of vomiting today. Symptoms have improved significantly since arrival to urgent care and since she has been able to sit down and rest in the urgent care.  History of asthma, states shortness of breath does not feel similar to an asthma exacerbation.  She has not used an inhaler.  She denies chest pain associated with symptoms and denies recent diarrhea, headache, skipped meals, dizziness, falls, urinary symptoms, abdominal pain, back pain, unilateral extremity weakness, paresthesias to the extremities, facial droop, speech changes, and visual disturbances.  She has never had thyroid  problems in the past.  Denies history of heart problems.  Additionally denies leg swelling and orthopnea.  No recent acute cough or nasal congestion/sore throat/viral URI symptoms. She admits to poor water intake and states she sometimes has energy drinks and other forms of caffeine/sugary beverages.  She had 2 energy drinks 2 days ago and the same day and has had minimal water intake over the last few days.  She has not attempted treatment of symptoms at home.      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 Authorizing Provider  ondansetron  (ZOFRAN -ODT) 4 MG disintegrating tablet Take 1 tablet (4 mg total) by mouth every 8 (eight) hours  as needed for nausea or vomiting. 07/07/24  Yes Enedelia Dorna HERO, FNP  albuterol  (VENTOLIN  HFA) 108 (90 Base) MCG/ACT inhaler Inhale 2 puffs into the lungs every 6 (six) hours as needed for wheezing or shortness of breath. 10/15/22   Leath-Warren, Etta PARAS, NP  fluticasone -salmeterol (ADVAIR DISKUS) 250-50 MCG/ACT AEPB Inhale 1 puff into the lungs in the morning and at bedtime. Rinse mouth with water after each use 11/20/21   Stuart Vernell Norris, PA-C  pantoprazole  (PROTONIX ) 20 MG tablet Take 1 tablet (20 mg total) by mouth daily. Patient not taking: Reported on 07/22/2023 11/28/22   Midge Golas, MD  phentermine (ADIPEX-P) 37.5 MG tablet Take 37.5 mg by mouth  daily before breakfast.    [provider]    Family History Family History  Problem Relation Age of Onset   Diabetes Mother     Social History Social History[1]   Allergies   Patient has no known allergies.   Review of Systems Review of Systems Per HPI  Physical Exam Triage Vital Signs ED Triage Vitals  Encounter Vitals Group     BP 07/07/24 1044 (!) 139/102     Girls Systolic BP Percentile --      Girls Diastolic BP Percentile --      Boys Systolic BP Percentile --      Boys Diastolic BP Percentile --      Pulse Rate 07/07/24 1044 (!) 114     Resp 07/07/24 1044 18     Temp 07/07/24 1044 98.9 F (37.2 C)     Temp Source 07/07/24 1044 Oral     SpO2 07/07/24 1044 99 %     Weight --      Height --      Head Circumference --      Peak Flow --      Pain Score 07/07/24 1057 0     Pain Loc --      Pain Education --      Exclude from Growth Chart --    No data found.  Updated Vital Signs BP (!) 135/95 Comment: taken by Rosaline, NP  Pulse 78 Comment: taken by Rosaline, NP  Temp 98.9 F (37.2 C) (Oral)   Resp 18   LMP 06/10/2015   SpO2 97% Comment: taken by Rosaline, NP  Visual Acuity Right Eye Distance:   Left Eye Distance:   Bilateral Distance:    Right Eye Near:   Left Eye Near:    Bilateral Near:     Physical Exam Vitals and nursing note reviewed.  Constitutional:      Appearance: She is not ill-appearing or toxic-appearing.  HENT:     Head: Normocephalic and atraumatic.     Right Ear: Hearing and external ear normal.     Left Ear: Hearing and external ear normal.     Nose: Nose normal.     Mouth/Throat:     Lips: Pink.     Mouth: Mucous membranes are moist. No injury or oral lesions.     Dentition: Normal dentition.     Tongue: No lesions.     Pharynx: Oropharynx is clear. Uvula midline. No pharyngeal swelling, oropharyngeal exudate, posterior oropharyngeal erythema, uvula swelling or postnasal drip.     Tonsils: No tonsillar  exudate.  Eyes:     General: Lids are normal. Vision grossly intact. Gaze aligned appropriately.     Extraocular Movements: Extraocular movements intact.     Conjunctiva/sclera: Conjunctivae normal.  Neck:  Trachea: Trachea and phonation normal.  Cardiovascular:     Rate and Rhythm: Normal rate and regular rhythm.     Pulses: Normal pulses.     Heart sounds: Normal heart sounds, S1 normal and S2 normal.     Comments: No skipped beats or murmurs. Pulmonary:     Effort: Pulmonary effort is normal. No respiratory distress.     Breath sounds: Normal breath sounds and air entry. No wheezing, rhonchi or rales.     Comments: Lungs clear.  Speaking in full sentences without difficulty. Chest:     Chest wall: No tenderness.  Musculoskeletal:     Cervical back: Neck supple.     Right lower leg: No edema.     Left lower leg: No edema.  Lymphadenopathy:     Cervical: No cervical adenopathy.  Skin:    General: Skin is warm and dry.     Capillary Refill: Capillary refill takes less than 2 seconds.     Findings: No rash.  Neurological:     General: No focal deficit present.     Mental Status: She is alert and oriented to person, place, and time. Mental status is at baseline.     Cranial Nerves: No cranial nerve deficit, dysarthria or facial asymmetry.     Sensory: No sensory deficit.     Motor: No weakness.     Coordination: Coordination normal.     Gait: Gait normal.     Deep Tendon Reflexes: Reflexes normal.  Psychiatric:        Mood and Affect: Mood normal.        Speech: Speech normal.        Behavior: Behavior normal.        Thought Content: Thought content normal.        Judgment: Judgment normal.      UC Treatments / Results  Labs (all labs ordered are listed, but only abnormal results are displayed) Labs Reviewed  POC COVID19/FLU A&B COMBO - Normal  CBC  BASIC METABOLIC PANEL WITH GFR  TSH    EKG   Radiology No results found.  Procedures Procedures  (including critical care time)  Medications Ordered in UC Medications - No data to display  Initial Impression / Assessment and Plan / UC Course  I have reviewed the triage vital signs and the nursing notes.  Pertinent labs & imaging results that were available during my care of the patient were reviewed by me and considered in my medical decision making (see chart for details).   1.  Heart palpitations, dehydration Heart palpitations likely caused by mild dehydration and excessive caffeine intake 2 days ago.  Heart rate initially elevated around 112, improved to 78 after drinking water throughout urgent care stay.  She has been able to tolerate water without vomiting and denies current nausea.  Cardiopulmonary and neuroexam are normal.  She is a candidate for oral rehydration as she is tolerating fluids at this time without vomiting or diarrhea. EKG shows sinus tachycardia with rate of 101 bpm, no ST/T wave changes. Considered but low suspicion for PE, ACS, pyelonephritis, etc.  CBC, BMP, and TSH pending to evaluate for further causes of heart palpitations.  Encourage follow-up with PCP and increased fluid intake to maintain adequate hydration.   Counseled patient on potential for adverse effects with medications prescribed/recommended today, strict ER and return-to-clinic precautions discussed, patient verbalized understanding.    Final Clinical Impressions(s) / UC Diagnoses   Final diagnoses:  Heart palpitations  Dehydration     Discharge Instructions      Your EKG looks good today, heart rate has come down to normal.  Blood work is pending, staff will call if blood work is abnormal. I suspect your symptoms are due to dehydration and caffeine intake, though I am also checking for other metabolic problems with your body including thyroid  problems which could also be causing heart palpitations.   Please decrease the amount of caffeine you are drinking and ensure you are drinking  at least 64 ounces of water per day (8 whole cups). Drink even MORE water if you are drinking any beverages with caffeine (sweet tea, energy drink, coffee, etc) or any sugary drinks.   Please make sure you are eating regularly and are not skipping meals.   Take zofran  every 8 hours as needed for nausea/vomiting.  Follow-up with your primary care provider to discuss symptoms further in about a week.  Go to the ER if your heart palpitations do not stop, if you develop chest pain, worsening shortness of breath, dizziness, if you pass out, or if you have persistent vomiting and diarrhea.     ED Prescriptions     Medication Sig Dispense Auth. Provider   ondansetron  (ZOFRAN -ODT) 4 MG disintegrating tablet Take 1 tablet (4 mg total) by mouth every 8 (eight) hours as needed for nausea or vomiting. 20 tablet Enedelia Dorna HERO, FNP      PDMP not reviewed this encounter.    [1]  Social History Tobacco Use   Smoking status: Never   Smokeless tobacco: Never  Vaping Use   Vaping status: Never Used  Substance Use Topics   Alcohol use: Never   Drug use: No     Enedelia Dorna HERO, FNP 07/07/24 1459  "

## 2024-07-07 NOTE — ED Triage Notes (Signed)
 Patient states she is SOB and has chills that started today. Patient states  I went to sleep and woke up feeling bad.

## 2024-07-09 ENCOUNTER — Ambulatory Visit (HOSPITAL_COMMUNITY): Payer: Self-pay | Admitting: Internal Medicine
